# Patient Record
Sex: Male | Born: 1980 | Race: White | Hispanic: No | Marital: Married | Smoking: Current every day smoker
Health system: Southern US, Community
[De-identification: ages and names within clinical notes are randomized; demographics above are authoritative.]

## PROBLEM LIST (undated history)

## (undated) DIAGNOSIS — I219 Acute myocardial infarction, unspecified: Secondary | ICD-10-CM

## (undated) HISTORY — PX: CARDIAC SURGERY: SHX584

## (undated) HISTORY — PX: PACEMAKER INSERTION: SHX728

---
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
## 1998-03-09 ENCOUNTER — Encounter: Admission: RE | Admit: 1998-03-09 | Discharge: 1998-03-09 | Payer: Self-pay | Admitting: Internal Medicine

## 1999-06-16 ENCOUNTER — Emergency Department (HOSPITAL_COMMUNITY): Admission: EM | Admit: 1999-06-16 | Discharge: 1999-06-16 | Payer: Self-pay | Admitting: Emergency Medicine

## 1999-08-06 ENCOUNTER — Emergency Department (HOSPITAL_COMMUNITY): Admission: EM | Admit: 1999-08-06 | Discharge: 1999-08-06 | Payer: Self-pay

## 2001-12-06 ENCOUNTER — Emergency Department (HOSPITAL_COMMUNITY): Admission: EM | Admit: 2001-12-06 | Discharge: 2001-12-06 | Payer: Self-pay | Admitting: Emergency Medicine

## 2002-02-16 ENCOUNTER — Emergency Department (HOSPITAL_COMMUNITY): Admission: EM | Admit: 2002-02-16 | Discharge: 2002-02-16 | Payer: Self-pay | Admitting: *Deleted

## 2002-02-16 ENCOUNTER — Encounter: Payer: Self-pay | Admitting: Emergency Medicine

## 2002-08-15 ENCOUNTER — Inpatient Hospital Stay (HOSPITAL_COMMUNITY): Admission: EM | Admit: 2002-08-15 | Discharge: 2002-08-19 | Payer: Self-pay | Admitting: Emergency Medicine

## 2002-08-15 ENCOUNTER — Encounter: Payer: Self-pay | Admitting: Emergency Medicine

## 2003-02-14 ENCOUNTER — Encounter: Admission: RE | Admit: 2003-02-14 | Discharge: 2003-02-14 | Payer: Self-pay | Admitting: Internal Medicine

## 2003-12-22 ENCOUNTER — Emergency Department (HOSPITAL_COMMUNITY): Admission: EM | Admit: 2003-12-22 | Discharge: 2003-12-22 | Payer: Self-pay | Admitting: Emergency Medicine

## 2004-10-06 ENCOUNTER — Emergency Department: Payer: Self-pay | Admitting: Emergency Medicine

## 2005-12-11 ENCOUNTER — Emergency Department (HOSPITAL_COMMUNITY): Admission: EM | Admit: 2005-12-11 | Discharge: 2005-12-11 | Payer: Self-pay | Admitting: Emergency Medicine

## 2006-10-09 ENCOUNTER — Emergency Department: Payer: Self-pay | Admitting: Emergency Medicine

## 2007-04-30 ENCOUNTER — Emergency Department (HOSPITAL_COMMUNITY): Admission: EM | Admit: 2007-04-30 | Discharge: 2007-04-30 | Payer: Self-pay | Admitting: Emergency Medicine

## 2009-02-23 ENCOUNTER — Inpatient Hospital Stay: Payer: Self-pay | Admitting: Internal Medicine

## 2009-11-11 ENCOUNTER — Emergency Department: Payer: Self-pay | Admitting: Emergency Medicine

## 2009-12-01 ENCOUNTER — Inpatient Hospital Stay: Payer: Self-pay | Admitting: Cardiovascular Disease

## 2009-12-01 DIAGNOSIS — I2510 Atherosclerotic heart disease of native coronary artery without angina pectoris: Secondary | ICD-10-CM

## 2009-12-01 DIAGNOSIS — E039 Hypothyroidism, unspecified: Secondary | ICD-10-CM

## 2010-02-17 ENCOUNTER — Encounter: Payer: Self-pay | Admitting: Cardiology

## 2010-02-17 ENCOUNTER — Observation Stay (HOSPITAL_COMMUNITY): Admission: EM | Admit: 2010-02-17 | Discharge: 2010-02-18 | Payer: Self-pay | Admitting: Emergency Medicine

## 2010-02-17 ENCOUNTER — Ambulatory Visit: Payer: Self-pay | Admitting: Internal Medicine

## 2010-08-26 LAB — CBC
HCT: 41.3 % (ref 39.0–52.0)
Hemoglobin: 13.6 g/dL (ref 13.0–17.0)
Hemoglobin: 14.4 g/dL (ref 13.0–17.0)
MCH: 28.1 pg (ref 26.0–34.0)
MCH: 28.8 pg (ref 26.0–34.0)
MCHC: 34.2 g/dL (ref 30.0–36.0)
MCV: 82.6 fL (ref 78.0–100.0)
Platelets: 251 10*3/uL (ref 150–400)
Platelets: 302 10*3/uL (ref 150–400)
RBC: 5 MIL/uL (ref 4.22–5.81)
RDW: 14.7 % (ref 11.5–15.5)

## 2010-08-26 LAB — POCT CARDIAC MARKERS
CKMB, poc: 1.2 ng/mL (ref 1.0–8.0)
CKMB, poc: 2 ng/mL (ref 1.0–8.0)
Myoglobin, poc: 44 ng/mL (ref 12–200)
Myoglobin, poc: 64.4 ng/mL (ref 12–200)
Troponin i, poc: <0.05 ng/mL (ref 0.00–0.09)

## 2010-08-26 LAB — BASIC METABOLIC PANEL
BUN: 8 mg/dL (ref 6–23)
CO2: 26 mEq/L (ref 19–32)
Calcium: 9.3 mg/dL (ref 8.4–10.5)
Creatinine, Ser: 0.98 mg/dL (ref 0.4–1.5)
GFR calc non Af Amer: >60 mL/min (ref >60)
Glucose, Bld: 98 mg/dL (ref 70–99)

## 2010-08-26 LAB — CULTURE, BLOOD (ROUTINE X 2): Culture: NO GROWTH

## 2010-08-26 LAB — DIFFERENTIAL
Eosinophils Absolute: 0.2 10*3/uL (ref 0.0–0.7)
Eosinophils Relative: 1 % (ref 0–5)
Lymphocytes Relative: 10 % — ABNORMAL LOW (ref 12–46)
Lymphs Abs: 2.1 10*3/uL (ref 0.7–4.0)
Monocytes Absolute: 1.1 10*3/uL — ABNORMAL HIGH (ref 0.1–1.0)
Monocytes Relative: 6 % (ref 3–12)

## 2010-08-26 LAB — URINALYSIS, ROUTINE W REFLEX MICROSCOPIC
Bilirubin Urine: NEGATIVE
Glucose, UA: NEGATIVE mg/dL
Hgb urine dipstick: NEGATIVE
Protein, ur: NEGATIVE mg/dL
Specific Gravity, Urine: 1.01 (ref 1.005–1.030)
Urobilinogen, UA: 0.2 mg/dL (ref 0.0–1.0)

## 2010-08-26 LAB — POCT I-STAT, CHEM 8
Chloride: 105 mEq/L (ref 96–112)
Creatinine, Ser: 1.2 mg/dL (ref 0.4–1.5)
Glucose, Bld: 154 mg/dL — ABNORMAL HIGH (ref 70–99)
Potassium: 4 mEq/L (ref 3.5–5.1)
Sodium: 135 mEq/L (ref 135–145)

## 2010-08-26 LAB — URINE CULTURE
Colony Count: NO GROWTH
Culture: NO GROWTH

## 2010-08-26 LAB — CARDIAC PANEL(CRET KIN+CKTOT+MB+TROPI)
CK, MB: 1.7 ng/mL (ref 0.3–4.0)
Relative Index: INVALID (ref 0.0–2.5)
Relative Index: INVALID (ref 0.0–2.5)
Total CK: 77 U/L (ref 7–232)
Total CK: 91 U/L (ref 7–232)
Troponin I: 0.02 ng/mL (ref 0.00–0.06)

## 2010-08-26 LAB — LIPID PANEL
Cholesterol: 114 mg/dL (ref 0–200)
Total CHOL/HDL Ratio: 2.7 RATIO

## 2010-08-26 LAB — PROTIME-INR: INR: 2.05 — ABNORMAL HIGH (ref 0.00–1.49)

## 2010-08-26 LAB — CK TOTAL AND CKMB (NOT AT ARMC): Relative Index: 1.8 (ref 0.0–2.5)

## 2010-08-26 LAB — TSH: TSH: 4.716 u[IU]/mL — ABNORMAL HIGH (ref 0.350–4.500)

## 2010-10-29 NOTE — Discharge Summary (Signed)
NAME:  Bryan Gardner, Bryan Gardner                          ACCOUNT NO.:  [id number]   MEDICAL RECORD NO.:  [id number]                   PATIENT TYPE:  INP   LOCATION:  3730                                 FACILITY:  [facility]   PHYSICIAN:  C. Ulyess Mort, M.D.             DATE OF BIRTH:  [birth year]   DATE OF ADMISSION:  08/15/2002  DATE OF DISCHARGE:  08/19/2002                                 DISCHARGE SUMMARY   DISCHARGE DIAGNOSES:  1. Tylenol overdose, intentional.  2. Depression not otherwise specified.  3. Hypokalemia.   DISCHARGE MEDICATIONS:  Zoloft 50 mg 1 p.o. daily for the first week and 2  p.o. daily for the second week, then 3 p.o. daily thereafter.   FOLLOW UP:  1. The patient will be seen in follow up in the [facility]     [facility] on Thursday, March 11, at 2:30 p.m. for followup hospital visit     and also to recheck a comprehensive metabolic panel.  2. The patient was given the phone number for [facility] with instructions that he may call them at his desire to arrange     followup appointment or counseling.   PROCEDURES:  None.   CONSULTATIONS:  Courtesy of Antonietta Breach, M.D., [facility].   HISTORY AND PHYSICAL:  The patient is a 30 year old white male with a  history of major depressive episodes and multiple previous suicide attempts  and hospitalizations.  He was  brought to the emergency department by  ambulance at approximately 2 a.m. on the morning of admission after  ingesting a handful of Tylenol and Advil approximately 1:30 in the morning  in intentional suicide attempt precipitated by breakup with patient's  girlfriend.  He denied any other ingestions and denies any previous history  of suicide attempts. The history of previous suicide attempts is provided by  the patient's mother.   In the emergency department, the patient received activated charcoal as well  as Zofran for nausea.  His initial  physical examination was unremarkable,  and he was awake, alert, oriented, and cooperative.   LABORATORY DATA:  Laboratory studies on admission included a sodium of 138,  potassium 3.7, chloride 106, bicarb 26, BUN 6, creatinine 1.1, and glucose  103.  Total bilirubin 0.4, alkaline phosphatase 51, total protein 6.2,  albumin 3.8.   PA and lateral chest film revealed no acute disease.   EKG was read as normal sinus rhythm with a rate of 71, normal intervals with  occasional sinus pauses.   The patient had a negative alcohol screen, negative urine drug screen  including PCAs .  The acetaminophen level at 2 a.m. was 211.  The  acetaminophen level at 4:45 a.m. was 183.2.    HOSPITAL COURSE:  1. TYLENOL OVERDOSE:  The patient was admitted to a telemetry bed with a 24-  hour sitter.  He was given a 10 g bolus of Mucomyst (acetylcysteine)     after his activated charcoal in the emergency department.  He was     continued on Mucomyst 5 g q.4h. for a total of 17 doses.  However,     because of initial vomiting after the charcoal and Mucomyst, an NG tube     was placed, and the patient received the balance of his Mucomyst through     his NG tube.  The patient was started on IV fluids with normal saline at     150 ml an hour.  The patient received a full course of Mucomyst given     that and the patient's four-hour Tylenol level placed him right at the     cutoff between unlikely hepatotoxicity and probable hepatotoxicity based     on the Rumack-Matthew nomogram for Tylenol intoxification.  Throughout     the hospital course, the patient's transaminase, pro time, and mental     status were monitored.  The only abnormality noted of which was a very     mild transaminitis which was first manifest on the morning of hospital     day #4 with an AST of 45 and ALT of 38.  On the morning of hospital day     #5, the AST was down to 38 while the ALT was minimally elevated further     at 45.   However, this was not felt to represent a potential for     significant liver injury. There is a paper published in the anals of     emergency medicine by Loyal Buba., et al, in 1995 which states that     patients who do develop acute liver injury following Tylenol, 100% will     have a transaminitis by 48 hours.  Therefore, the patient was felt stable     for discharge after completing the full course of Mucomyst.  He will have     followup at [facility] on Thursday, March 11,     where we will repeat a comprehensive metabolic panel and again look     closely at his transaminases.   1. DEPRESSION NOT OTHERWISE SPECIFIED:  It was initially felt that the     patient had taken an intentional overdose of Tylenol in a suicide     attempt. As such, the patient was assigned a sitter to stay with him 24     hours until the patient could be cleared by psychiatry.  Dr. Jeanie Sewer     came by to see the patient on the evening of hospital day #2 and felt     that the patient's action most likely represented an Axis II basis     manifesting as a stress reactive crisis; however, he felt that the     patient would benefit from restarting on SSRIs.  Dr. Jeanie Sewer felt that     the patient was not acutely at risk of harming himself; however, he     recommended psychiatric followup. At this point, the sitter was     discontinued, and the patient was felt to no longer be a suicide risk.     On hospital day #3, the patient was restarted on Zoloft 15 mg p.o. daily.     At the time of discharge, the patient denied suicidal ideation and was     looking forward to leaving the hospital and returning  back to his usual     activities.   1. HYPOKALEMIA:  The patient had an admission potassium level of 3.7.     However, on the morning of hospital day #3, the patient was noted to be    hypokalemic at 3.0.  Potassium was replaced with 80 mEq potassium     chloride.  On the morning of  hospital day #4, the patient's potassium was     3.2, and he was again given potassium chloride replacement p.o.  On the     morning of hospital day #5, the patient's potassium was 3.1, and this was     replaced again with p.o. potassium.  It was felt possibly that the     patient's NG tube or the activated charcoal was possibly interfering with     his p.o. potassium absorption.  A magnesium level was checked which was     within normal limits at 1.8, and the patient remained asymptomatic. We     will follow this when we see the patient back in the clinic on Thursday.    DISCHARGE LABORATORY DATA:  Sodium 143, potassium 3.1, chloride 103, bicarb  29, BUN 7, creatinine 1.0, glucose 108, AST 38, ALT 45, total bilirubin 0.6,  alkaline phosphatase 56.  Magnesium level 1.8.  Total protein 6.8, albumin  4.0, calcium 9.7.     Thayer Headings, M.D.                     Gary Fleet, M.D.    BM/MEDQ  D:  08/19/2002  T:  08/20/2002  Job:  [id number]   cc:   Antonietta Breach, M.D.  [address]  [place], [place] [address]  Fax: [phone]   Outpatient Clinic

## 2010-11-16 DIAGNOSIS — Z720 Tobacco use: Secondary | ICD-10-CM | POA: Insufficient documentation

## 2011-03-22 LAB — DIFFERENTIAL
Eosinophils Relative: 2
Lymphocytes Relative: 27
Lymphs Abs: 3.2
Monocytes Relative: 7
Neutrophils Relative %: 64

## 2011-03-22 LAB — CBC
HCT: 44.1
Platelets: 277
RBC: 5.08
WBC: 12 — ABNORMAL HIGH

## 2011-03-22 LAB — URINALYSIS, ROUTINE W REFLEX MICROSCOPIC
Glucose, UA: NEGATIVE
Ketones, ur: 15 — AB
Nitrite: NEGATIVE
Protein, ur: NEGATIVE
Urobilinogen, UA: 1

## 2011-03-22 LAB — I-STAT 8, (EC8 V) (CONVERTED LAB)
BUN: 10
Bicarbonate: 26.5 — ABNORMAL HIGH
Glucose, Bld: 86
Hemoglobin: 16
Sodium: 139
pH, Ven: 7.399 — ABNORMAL HIGH

## 2012-09-18 DIAGNOSIS — Z95810 Presence of automatic (implantable) cardiac defibrillator: Secondary | ICD-10-CM | POA: Insufficient documentation

## 2012-09-18 DIAGNOSIS — I5022 Chronic systolic (congestive) heart failure: Secondary | ICD-10-CM | POA: Insufficient documentation

## 2012-09-19 DIAGNOSIS — I513 Intracardiac thrombosis, not elsewhere classified: Secondary | ICD-10-CM | POA: Insufficient documentation

## 2012-09-19 DIAGNOSIS — E785 Hyperlipidemia, unspecified: Secondary | ICD-10-CM | POA: Insufficient documentation

## 2016-01-05 ENCOUNTER — Encounter (HOSPITAL_COMMUNITY): Payer: Self-pay | Admitting: *Deleted

## 2016-01-05 ENCOUNTER — Emergency Department (HOSPITAL_COMMUNITY): Payer: Medicare Other

## 2016-01-05 ENCOUNTER — Emergency Department (HOSPITAL_COMMUNITY)
Admission: EM | Admit: 2016-01-05 | Discharge: 2016-01-06 | Disposition: A | Discharge status: Other Acute Inpatient Hospital | Payer: Medicare Other | Attending: Emergency Medicine | Admitting: Emergency Medicine

## 2016-01-05 DIAGNOSIS — Z79899 Other long term (current) drug therapy: Secondary | ICD-10-CM | POA: Diagnosis not present

## 2016-01-05 DIAGNOSIS — T1491XA Suicide attempt, initial encounter: Secondary | ICD-10-CM

## 2016-01-05 DIAGNOSIS — F29 Unspecified psychosis not due to a substance or known physiological condition: Secondary | ICD-10-CM | POA: Diagnosis not present

## 2016-01-05 DIAGNOSIS — R45851 Suicidal ideations: Secondary | ICD-10-CM | POA: Diagnosis not present

## 2016-01-05 DIAGNOSIS — T1491 Suicide attempt: Secondary | ICD-10-CM | POA: Diagnosis not present

## 2016-01-05 HISTORY — DX: Acute myocardial infarction, unspecified: I21.9

## 2016-01-05 LAB — COMPREHENSIVE METABOLIC PANEL
ALT: 16 U/L — ABNORMAL LOW (ref 17–63)
AST: 21 U/L (ref 15–41)
Albumin: 4.2 g/dL (ref 3.5–5.0)
Alkaline Phosphatase: 80 U/L (ref 38–126)
Anion gap: 6 (ref 5–15)
BUN: 5 mg/dL — AB (ref 6–20)
CALCIUM: 9.5 mg/dL (ref 8.9–10.3)
CHLORIDE: 105 mmol/L (ref 101–111)
CO2: 24 mmol/L (ref 22–32)
CREATININE: 1.1 mg/dL (ref 0.61–1.24)
GFR calc Af Amer: >60 mL/min (ref >60)
Glucose, Bld: 114 mg/dL — ABNORMAL HIGH (ref 65–99)
POTASSIUM: 3.7 mmol/L (ref 3.5–5.1)
Sodium: 135 mmol/L (ref 135–145)
TOTAL PROTEIN: 7.4 g/dL (ref 6.5–8.1)
Total Bilirubin: 0.5 mg/dL (ref 0.3–1.2)

## 2016-01-05 LAB — CBC WITH DIFFERENTIAL/PLATELET
Basophils Absolute: 0 10*3/uL (ref 0.0–0.1)
Basophils Relative: 0 %
EOS PCT: 0 %
Eosinophils Absolute: 0 10*3/uL (ref 0.0–0.7)
HCT: 41 % (ref 39.0–52.0)
Hemoglobin: 14.1 g/dL (ref 13.0–17.0)
LYMPHS ABS: 1.4 10*3/uL (ref 0.7–4.0)
LYMPHS PCT: 10 %
MCH: 27.8 pg (ref 26.0–34.0)
MCHC: 34.4 g/dL (ref 30.0–36.0)
MCV: 80.7 fL (ref 78.0–100.0)
MONO ABS: 0.6 10*3/uL (ref 0.1–1.0)
MONOS PCT: 4 %
Neutro Abs: 12.9 10*3/uL — ABNORMAL HIGH (ref 1.7–7.7)
Neutrophils Relative %: 86 %
PLATELETS: 266 10*3/uL (ref 150–400)
RBC: 5.08 MIL/uL (ref 4.22–5.81)
RDW: 13.7 % (ref 11.5–15.5)
WBC: 14.9 10*3/uL — ABNORMAL HIGH (ref 4.0–10.5)

## 2016-01-05 LAB — RAPID URINE DRUG SCREEN, HOSP PERFORMED
AMPHETAMINES: NOT DETECTED
Barbiturates: NOT DETECTED
Benzodiazepines: NOT DETECTED
Cocaine: NOT DETECTED
Opiates: NOT DETECTED
Tetrahydrocannabinol: NOT DETECTED

## 2016-01-05 LAB — ACETAMINOPHEN LEVEL

## 2016-01-05 LAB — ETHANOL: Alcohol, Ethyl (B): <5 mg/dL (ref <5)

## 2016-01-05 LAB — SALICYLATE LEVEL

## 2016-01-05 MED ORDER — LISINOPRIL 10 MG PO TABS
5.0000 mg | ORAL_TABLET | Freq: Every day | ORAL | Status: DC
  Administered 2016-01-05 – 2016-01-06 (×2): 5 mg via ORAL
  Filled 2016-01-05 (×3): qty 1

## 2016-01-05 MED ORDER — LORAZEPAM 1 MG PO TABS: 1.0000 mg | ORAL_TABLET | Freq: Three times a day (TID) | ORAL | Status: DC | PRN

## 2016-01-05 MED ORDER — NICOTINE 21 MG/24HR TD PT24
21.0000 mg | MEDICATED_PATCH | Freq: Every day | TRANSDERMAL | Status: DC
Start: 2016-01-05 — End: 2016-01-06
  Administered 2016-01-06: 21 mg via TRANSDERMAL
  Filled 2016-01-05 (×2): qty 1

## 2016-01-05 MED ORDER — CARVEDILOL 12.5 MG PO TABS
6.2500 mg | ORAL_TABLET | Freq: Two times a day (BID) | ORAL | Status: DC
  Administered 2016-01-05 – 2016-01-06 (×3): 6.25 mg via ORAL
  Filled 2016-01-05 (×3): qty 1

## 2016-01-05 MED ORDER — ATORVASTATIN CALCIUM 40 MG PO TABS
40.0000 mg | ORAL_TABLET | Freq: Every evening | ORAL | Status: DC
  Administered 2016-01-05: 40 mg via ORAL
  Filled 2016-01-05: qty 1

## 2016-01-05 MED ORDER — IBUPROFEN 200 MG PO TABS: 600.0000 mg | ORAL_TABLET | Freq: Three times a day (TID) | ORAL | Status: DC | PRN

## 2016-01-05 MED ORDER — ONDANSETRON HCL 4 MG PO TABS: 4.0000 mg | ORAL_TABLET | Freq: Three times a day (TID) | ORAL | Status: DC | PRN

## 2016-01-05 MED ORDER — ACETAMINOPHEN 325 MG PO TABS: 650.0000 mg | ORAL_TABLET | ORAL | Status: DC | PRN

## 2016-01-05 MED ORDER — SPIRONOLACTONE 25 MG PO TABS
25.0000 mg | ORAL_TABLET | Freq: Every day | ORAL | Status: DC
  Administered 2016-01-05: 25 mg via ORAL
  Filled 2016-01-05 (×2): qty 1

## 2016-01-05 MED ORDER — SERTRALINE HCL 50 MG PO TABS
50.0000 mg | ORAL_TABLET | Freq: Every day | ORAL | Status: DC
  Administered 2016-01-05 – 2016-01-06 (×2): 50 mg via ORAL
  Filled 2016-01-05 (×2): qty 1

## 2016-01-05 MED ORDER — ASPIRIN EC 81 MG PO TBEC
81.0000 mg | DELAYED_RELEASE_TABLET | Freq: Every day | ORAL | Status: DC
  Administered 2016-01-05 – 2016-01-06 (×2): 81 mg via ORAL
  Filled 2016-01-05 (×2): qty 1

## 2016-01-05 MED ORDER — ZOLPIDEM TARTRATE 5 MG PO TABS: 5.0000 mg | ORAL_TABLET | Freq: Every evening | ORAL | Status: DC | PRN

## 2016-01-05 MED ORDER — FUROSEMIDE 20 MG PO TABS
40.0000 mg | ORAL_TABLET | Freq: Two times a day (BID) | ORAL | Status: DC
  Administered 2016-01-05 – 2016-01-06 (×3): 40 mg via ORAL
  Filled 2016-01-05 (×3): qty 2

## 2016-01-05 MED ORDER — ALUM & MAG HYDROXIDE-SIMETH 200-200-20 MG/5ML PO SUSP: 30.0000 mL | ORAL | Status: DC | PRN

## 2016-01-05 NOTE — ED Notes (Signed)
Sitter arrived

## 2016-01-05 NOTE — ED Notes (Signed)
Pt speaking with his mother and has now used all of his phone calls for the day.

## 2016-01-05 NOTE — ED Triage Notes (Signed)
Pt arrives from home via [facility] and also being escorted by [employer]. Pt was found by a friend standing on a pain bucket with a cord wrapped around his neck. The pt then kicked the paint bucket out from under him and began flailing around, the friend attempted to cut pt down with no success. Friend states the pt then began convulsing and had eye rolling with a positive loc. The friend put his fingers in between the cord and the pt neck and began to pull, effectively freeing the pt. The pt hit his head on a piece of wood after falling to the floor. Upon regaining consciousness the pt then got up and ran away. Pt was unable to run far rt an extensive cardiac hx.

## 2016-01-05 NOTE — ED Provider Notes (Signed)
[facility] DEPT Provider Note   CSN: [id number] Arrival date & time: 01/05/16  1418  First Provider Contact:  First MD Initiated Contact with Patient 01/05/16 1427        History   Chief Complaint No chief complaint on file.   HPI Bryan Gardner is a 35 y.o. male.  The history is provided by the patient, the police and medical records.   35 year old male with hx of CHF here after attempted suicide.  Patient reports for the past 3 days he has been feeling increasingly depressed. He reports this is mostly because he is having significant strain on the relationship with the mother of his 44 year old son. He states today he tried to hang himself with an old TV cord from in his garage. He reports his friend walked in shortly after and cut him down with a knife. He reports he was only hanging therefore 15 seconds or less. He denies any loss of consciousness. Friend called EMS and he was placed in a c-collar on the scene. Patient reports he does have history of depression and takes generic Zoloft for this. He states initially he felt it was helping but not so much recently. He does report occasional marijuana use. No significant alcohol use. No history of suicidal actions in the past.  Denies homicidal ideation.  No hallucinations.  Patient admits that his actions today were "stupid" because he knows that his son needs him.  States his throat feels sore currently, no other complaints.  Specifically denies any chest pain or shortness of breath.  No past medical history on file.  There are no active problems to display for this patient.   No past surgical history on file.     Home Medications    Prior to Admission medications   Not on File    Family History No family history on file.  Social History Social History  Substance Use Topics  . Smoking status: Not on file  . Smokeless tobacco: Not on file  . Alcohol use Not on file     Allergies   Review of patient's allergies  indicates not on file.   Review of Systems Review of Systems  Musculoskeletal: Positive for neck pain.  Psychiatric/Behavioral: Positive for suicidal ideas.  All other systems reviewed and are negative.    Physical Exam Updated Vital Signs BP 122/86 (BP Location: Right Arm)   Pulse 93   Temp 99.1 F (37.3 C) (Oral)   Resp 16   SpO2 96%   Physical Exam  Constitutional: He is oriented to person, place, and time. He appears well-developed and well-nourished.  HENT:  Head: Normocephalic and atraumatic.  Mouth/Throat: Oropharynx is clear and moist.  Eyes: Conjunctivae and EOM are normal. Pupils are equal, round, and reactive to light.  Neck:  Ligature marks noted to neck; no tracheal deviation or swelling noted; no oropharyngeal edema, handling secretions well, normal phonation without stridor c-collar in place; ROM not tested  Cardiovascular: Normal rate, regular rhythm and normal heart sounds.   Pulmonary/Chest: Effort normal and breath sounds normal.  Defibrillator left chest wall, no signs of surrounding infection  Abdominal: Soft. Bowel sounds are normal.  Musculoskeletal: Normal range of motion.  Neurological: He is alert and oriented to person, place, and time.  AAOx3, answering questions and following commands appropriately; equal strength UE and LE bilaterally; CN grossly intact; moves all extremities appropriately without ataxia; no focal neuro deficits or facial asymmetry appreciated  Skin: Skin is warm and dry.  Psychiatric: He has a normal mood and affect. He is not withdrawn. He expresses suicidal ideation. He expresses suicidal plans. He expresses no homicidal plans.  Nursing note and vitals reviewed.    ED Treatments / Results  Labs (all labs ordered are listed, but only abnormal results are displayed) Labs Reviewed  CBC WITH DIFFERENTIAL/PLATELET - Abnormal; Notable for the following:       Result Value   WBC 14.9 (*)    Neutro Abs 12.9 (*)    All other  components within normal limits  ACETAMINOPHEN LEVEL - Abnormal; Notable for the following:    Acetaminophen (Tylenol), Serum <10 (*)    All other components within normal limits  COMPREHENSIVE METABOLIC PANEL - Abnormal; Notable for the following:    Glucose, Bld 114 (*)    BUN 5 (*)    ALT 16 (*)    All other components within normal limits  SALICYLATE LEVEL  ETHANOL  URINE RAPID DRUG SCREEN, HOSP PERFORMED    EKG  EKG Interpretation None       Radiology Ct Cervical Spine Wo Contrast  Result Date: 01/05/2016 CLINICAL DATA:  Suicide attempt. The patient tried to hang himself with a television cord. EXAM: CT CERVICAL SPINE WITHOUT CONTRAST TECHNIQUE: Multidetector CT imaging of the cervical spine was performed without intravenous contrast. Multiplanar CT image reconstructions were also generated. COMPARISON:  None. FINDINGS: The cervical spine is imaged from skullbase through C7-T1. Vertebral body heights alignment are maintained. Mild uncovertebral spurring is present at C3-4, C5-6, and C6-7. Osseous foraminal narrowing is most prominent on the left at C6-7. No acute fracture or traumatic subluxation is present. Despite the given history common no significant soft tissue injury is present. High-density tissue is present of the tongue base near the level of the hyoid measuring 3.6 x 2.5 x 2.7 cm. There is additional high attenuation soft tissue at the foramen cecum measuring 1.51.3 x 2.4 cm. Of note, no thyroid tissue is evident within the thyroid bed. The lung apices demonstrate apical coli. IMPRESSION: 1. No acute fracture or traumatic subluxation. 2. No acute soft tissue injury. 3. Ectopic thyroid tissue at tongue base without significant thyroid at the level of thyroid cartilage. 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 Electronically Signed   By: Marin Roberts M.D.   On: 01/05/2016 15:15   Procedures Procedures (including critical care  time)  Medications Ordered in ED Medications - No data to display   Initial Impression / Assessment and Plan / ED Course  I have reviewed the triage vital signs and the nursing notes.  Pertinent labs & imaging results that were available during my care of the patient were reviewed by me and considered in my medical decision making (see chart for details).  Clinical Course   35 year old male here after attempted suicide by hanging. He hung himself with a TV cord was cut down by his friend after 15 seconds later.  He arrives via EMS with c-collar in place. Patient is awake, alert, fully oriented. He does report a sore throat but denies any other complaints. He does have ligature marks of the neck without any tracheal deviation or swelling. His oropharynx is clear, normal phonation without stridor. CT cervical spine obtained, no acute fracture or underlying soft tissue injuries. His lab work is reassuring. Patient has tolerated water without difficulty. He has no other complaints at this time. Patient is currently here voluntarily, has been calm and cooperative. Patient placed into psych hold.  Will  consult TTS.  Holding orders and home meds in place.  1800-- patient began questioning when he could leave and if he had to stay here for evaluation or not.  He did not threaten to leave or make any gesture to leave.  IVC paperwork was filled out as a precaution.  Final Clinical Impressions(s) / ED Diagnoses   Final diagnoses:  Suicide attempt [facility])    New Prescriptions New Prescriptions   No medications on file     Garlon Hatchet, Cordelia Poche 01/05/16 1933    Jacalyn Lefevre, MD 01/06/16 [phone]

## 2016-01-05 NOTE — ED Notes (Signed)
Pt changed into scrubs and belongings  (2bags) taken over to pod C behind nurse's station.

## 2016-01-05 NOTE — ED Notes (Signed)
IVC paperwork faxed to magistrate by Loma Newton, unit secretary. Original copy placed in red folder on SW desk. Copies placed on clipboard in patient chart.

## 2016-01-05 NOTE — ED Notes (Signed)
Pt moved to room 22 ambulates with steady gait. respeven and non labored. C/o sore throat.

## 2016-01-06 ENCOUNTER — Encounter (HOSPITAL_COMMUNITY): Payer: Self-pay

## 2016-01-06 ENCOUNTER — Inpatient Hospital Stay (HOSPITAL_COMMUNITY)
Admission: AD | Admit: 2016-01-06 | Discharge: 2016-01-11 | DRG: 885 | Disposition: A | Discharge status: Home / Self Care | Payer: Medicare Other | Attending: Psychiatry | Admitting: Psychiatry

## 2016-01-06 DIAGNOSIS — I25119 Atherosclerotic heart disease of native coronary artery with unspecified angina pectoris: Secondary | ICD-10-CM | POA: Diagnosis present

## 2016-01-06 DIAGNOSIS — G4700 Insomnia, unspecified: Secondary | ICD-10-CM | POA: Diagnosis present

## 2016-01-06 DIAGNOSIS — Z950 Presence of cardiac pacemaker: Secondary | ICD-10-CM

## 2016-01-06 DIAGNOSIS — F329 Major depressive disorder, single episode, unspecified: Secondary | ICD-10-CM | POA: Diagnosis not present

## 2016-01-06 DIAGNOSIS — F17210 Nicotine dependence, cigarettes, uncomplicated: Secondary | ICD-10-CM | POA: Diagnosis present

## 2016-01-06 DIAGNOSIS — I252 Old myocardial infarction: Secondary | ICD-10-CM | POA: Diagnosis not present

## 2016-01-06 DIAGNOSIS — T1491 Suicide attempt: Secondary | ICD-10-CM | POA: Diagnosis not present

## 2016-01-06 DIAGNOSIS — F322 Major depressive disorder, single episode, severe without psychotic features: Secondary | ICD-10-CM | POA: Diagnosis not present

## 2016-01-06 DIAGNOSIS — Z79899 Other long term (current) drug therapy: Secondary | ICD-10-CM | POA: Diagnosis not present

## 2016-01-06 MED ORDER — TRAZODONE HCL 50 MG PO TABS
50.0000 mg | ORAL_TABLET | Freq: Every evening | ORAL | Status: DC | PRN
  Filled 2016-01-06 (×4): qty 1

## 2016-01-06 MED ORDER — SERTRALINE HCL 50 MG PO TABS
50.0000 mg | ORAL_TABLET | Freq: Every day | ORAL | Status: DC
  Administered 2016-01-07: 50 mg via ORAL
  Filled 2016-01-06 (×3): qty 1

## 2016-01-06 MED ORDER — ATORVASTATIN CALCIUM 40 MG PO TABS
40.0000 mg | ORAL_TABLET | Freq: Every evening | ORAL | Status: DC
  Administered 2016-01-06 – 2016-01-10 (×5): 40 mg via ORAL
  Filled 2016-01-06 (×7): qty 1

## 2016-01-06 MED ORDER — FUROSEMIDE 40 MG PO TABS
40.0000 mg | ORAL_TABLET | Freq: Two times a day (BID) | ORAL | Status: DC
  Administered 2016-01-06 – 2016-01-11 (×10): 40 mg via ORAL
  Filled 2016-01-06 (×15): qty 1

## 2016-01-06 MED ORDER — CARVEDILOL 6.25 MG PO TABS
6.2500 mg | ORAL_TABLET | Freq: Two times a day (BID) | ORAL | Status: DC
  Administered 2016-01-06 – 2016-01-11 (×10): 6.25 mg via ORAL
  Filled 2016-01-06 (×14): qty 1

## 2016-01-06 MED ORDER — NITROGLYCERIN 0.4 MG SL SUBL: 0.4000 mg | SUBLINGUAL_TABLET | SUBLINGUAL | Status: DC | PRN

## 2016-01-06 MED ORDER — LISINOPRIL 5 MG PO TABS
5.0000 mg | ORAL_TABLET | Freq: Every day | ORAL | Status: DC
  Administered 2016-01-07 – 2016-01-11 (×5): 5 mg via ORAL
  Filled 2016-01-06 (×8): qty 1

## 2016-01-06 MED ORDER — MAGNESIUM HYDROXIDE 400 MG/5ML PO SUSP: 30.0000 mL | Freq: Every day | ORAL | Status: DC | PRN

## 2016-01-06 MED ORDER — ALUM & MAG HYDROXIDE-SIMETH 200-200-20 MG/5ML PO SUSP: 30.0000 mL | ORAL | Status: DC | PRN

## 2016-01-06 MED ORDER — SPIRONOLACTONE 25 MG PO TABS
25.0000 mg | ORAL_TABLET | Freq: Every day | ORAL | Status: DC
  Administered 2016-01-06 – 2016-01-11 (×6): 25 mg via ORAL
  Filled 2016-01-06 (×8): qty 1

## 2016-01-06 MED ORDER — HYDROXYZINE HCL 25 MG PO TABS
25.0000 mg | ORAL_TABLET | Freq: Three times a day (TID) | ORAL | Status: DC | PRN
  Administered 2016-01-10: 25 mg via ORAL
  Filled 2016-01-06: qty 1

## 2016-01-06 MED ORDER — ASPIRIN EC 81 MG PO TBEC
81.0000 mg | DELAYED_RELEASE_TABLET | Freq: Every day | ORAL | Status: DC
  Administered 2016-01-07 – 2016-01-11 (×5): 81 mg via ORAL
  Filled 2016-01-06 (×8): qty 1

## 2016-01-06 MED ORDER — ACETAMINOPHEN 325 MG PO TABS
650.0000 mg | ORAL_TABLET | Freq: Four times a day (QID) | ORAL | Status: DC | PRN
Start: 2016-01-06 — End: 2016-01-11

## 2016-01-06 MED ORDER — NICOTINE 21 MG/24HR TD PT24
21.0000 mg | MEDICATED_PATCH | Freq: Every day | TRANSDERMAL | Status: DC
  Filled 2016-01-06 (×7): qty 1

## 2016-01-06 NOTE — ED Notes (Signed)
Pt refused ordered breakfast tray earlier because he "doesn't like breakfast foods." Pt agreed that a [place] sandwich would work though. Sandwich bag and drink given.

## 2016-01-06 NOTE — ED Notes (Signed)
Report called to Christa, RN at [facility]. Advised NS to contact [employer] for transport.

## 2016-01-06 NOTE — ED Notes (Signed)
Pt departed in custody of [employer], en route to [facility]. Belongings retrieved from storage and security safe. Signed for by pt, and given to [facility] officers. Pt departed in NAD.

## 2016-01-06 NOTE — ED Notes (Signed)
Attempted to call report to [facility] [id number]. Was informed that pt's bed was a "tentative placement" and thus not ready to received report or pt. Informed them that [employer] has already been contacted for transport. Told I would received follow-up phone call.

## 2016-01-06 NOTE — Progress Notes (Signed)
Pt accepted to [facility] bed 404-1 by Dr. Lucianne Muss, attending Dr. Jama Flavors. Number to call report is [phone].  Ilean Skill, MSW, LCSW Clinical Social Work, Disposition  01/06/2016 [phone]

## 2016-01-06 NOTE — Progress Notes (Signed)
Bryan Gardner is a 35 year old male being admitted involuntarily to 404-1 from [facility].  He was IVC'd for symptoms of depression and SI by the ED physician. Pt attempted to hang himself in his garage yesterday with a TV cord.  A friend happened by and intervened to save his life by cutting him down.  Recent stressors are that his girlfriend decided to break off their relationship and moved with their 65 year old son to [place]. 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 He reports that he is prescribed medication for his depression by his cardiologist but does not take them as prescribed.  He continues to report feeling depressed but "I am feeling much better."  He denies current SI/HI or A/V hallucinations.  He is diagnosed with Major Depressive Disorder, recurrent, severe.  Admission paperwork completed and signed.  Belongings searched and secured in locker # 40.  Skin assessment completed and noted defibrillator placement surgery scar.  Q 15 minute checks initiated for safety.  We will monitor the progress towards his goals.

## 2016-01-06 NOTE — ED Provider Notes (Signed)
  Physical Exam  BP 115/72   Pulse (!) 58   Temp 98.4 F (36.9 C)   Resp 18   SpO2 98%   Physical Exam  ED Course  Procedures  MDM Patient seen by TTS. Will admit to [facility] under Dr. Jama Flavors. Medically cleared by previous provider.       Charlynne Pander, MD 01/06/16 1155

## 2016-01-06 NOTE — ED Notes (Signed)
Contacted by [facility]. Pt has placement there. Room 404, Bed 1. Receiving MD is Dr. Jama Flavors. Call report to [phone].

## 2016-01-06 NOTE — Progress Notes (Signed)
Patient ID: Bryan Gardner, male   DOB: [birth year], 35 y.o.   MRN: [id number] PER STATE REGULATIONS 482.30  THIS CHART WAS REVIEWED FOR MEDICAL NECESSITY WITH RESPECT TO THE PATIENT'S ADMISSION/DURATION OF STAY.  NEXT REVIEW DATE:01/10/16  Loura Halt, RN, BSN CASE MANAGER

## 2016-01-06 NOTE — Tx Team (Signed)
Initial Interdisciplinary Treatment Plan   PATIENT STRESSORS: Health problems Loss of girlfriend (recent break up) Medication change or noncompliance   PATIENT STRENGTHS: Capable of independent living General fund of knowledge Supportive family/friends   PROBLEM LIST: Problem List/Patient Goals Date to be addressed Date deferred Reason deferred Estimated date of resolution  Depression 01/06/16     Suicide attempt 01/06/16     "I need some kind of stress management" 01/06/16     "I would like some options for outpatient treatment when I leave" 01/06/16                                    DISCHARGE CRITERIA:  Improved stabilization in mood, thinking, and/or behavior Verbal commitment to aftercare and medication compliance  PRELIMINARY DISCHARGE PLAN: Outpatient therapy Medication management  PATIENT/FAMIILY INVOLVEMENT: This treatment plan has been presented to and reviewed with the patient, ASSAD MUHAMMED.  The patient and family have been given the opportunity to ask questions and make suggestions.  Norm Parcel Aliece Honold 01/06/2016, 1:56 PM

## 2016-01-06 NOTE — BH Assessment (Addendum)
Tele Assessment Note   Bryan Gardner is an 35 y.o.twice divorced male who presents involuntarily (IVC'd by EDP) accompanied by [employer] reporting symptoms of depression and SI . Pt attempted to hang himself in his garage yesterday with a TV cord.  A friend happened by and intervened to save his life by cutting him down.  Prior to ED visit, pt reports that his GF decided to break off their relationship and moved with their 15 yo son to [place]. Pt reports that he had already been struggling with depression from a hx of heart conditions and surgeries. 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 Pt reports symptoms of depression including deep sadness, fatigue, excessive guilt, decreased self esteem, tearfulness & crying spells, self isolation, lack of motivation for activities and pleasure, irritability, negative outlook, difficulty thinking & concentrating, feeling helpless and hopeless.  Pt reports medication current prescribed by his cardiologist who prescribed an anti-depressant which pt sts he does not take as prescribed. Pt denies past suicide attempts. Pt denies homicidal ideation & a history of violence or anger outbursts. Legal history includes no arrests, past or present  Pt denies auditory or visual hallucinations or other psychotic symptoms.   Pt lives with a friend and supports include his mother . Pt reports completing school through the 10th grade. Pt's source of income includes disability income. Pt has poor insight and impaired judgment. Pt's memory seems intact. No history of physical, verbal, emotional or sexual abuse. Pt sts average sleep hours each night are about 7 hours and eats regularly and well having no weight loss or gain recently.   ? Pt's psychiatric history includes no OP treatment and no psychiatric hospitalizations. Pt admits alcohol/ substance abuse including current use of nicotine, alcohol and  occasional marijuana. Pt's BAL was <5 and UDS was negative for all substances tested for in the ED today.  ? MSE: Pt is dressed in scrubs, seems calm, oriented x4 with normal speech and normal motor behavior. Eye contact is good. Pt's mood is stated as depressed and affect appears blunted.  Affect seems congruent with mood. Thought process is coherent and relevant. There is no indication pt is currently responding to internal stimuli or experiencing delusional thought content. Pt was cooperative throughout assessment.    Diagnosis: MDD, Severe, Recurrent  Past Medical History:  Past Medical History:  Diagnosis Date  . Myocardial infarction [facility])     Past Surgical History:  Procedure Laterality Date  . CARDIAC SURGERY    . PACEMAKER INSERTION      Family History: History reviewed. No pertinent family history.  Social History:  reports that he has been smoking.  He has never used smokeless tobacco. He reports that he does not drink alcohol. His drug history is not on file.  Additional Social History:  Alcohol / Drug Use Prescriptions: see MAR History of alcohol / drug use?: Yes Longest period of sobriety (when/how long): unknown Substance #1 Name of Substance 1: Cannabis 1 - Age of First Use: 33 1 - Amount (size/oz): unknown 1 - Frequency: 2 x yr 1 - Duration: ongoing 1 - Last Use / Amount: last month Substance #2 Name of Substance 2: Alcohol 2 - Age of First Use: 19 2 - Amount (size/oz): unknown 2 - Frequency: Socially- 2 x month 2 - Duration: ongoing 2 - Last Use / Amount: last month Substance #3 Name of Substance 3: Nicotine/Cigarettes 3 -  Age of First Use: 9 3 - Amount (size/oz): 1 pack 3 - Frequency: daily 3 - Duration: ongoing 3 - Last Use / Amount: yesterday  CIWA: CIWA-Ar BP: 116/68 Pulse Rate: 75 COWS:    PATIENT STRENGTHS: (choose at least two) Average or above average intelligence Capable of independent living Communication skills Supportive  family/friends  Allergies:  Allergies  Allergen Reactions  . Bupropion Nausea And Vomiting    Home Medications:  (Not in a hospital admission)  OB/GYN Status:  No LMP for male patient.  General Assessment Data Location of Assessment: [facility] ED TTS Assessment: In system Is this a Tele or Face-to-Face Assessment?: Tele Assessment Is this an Initial Assessment or a Re-assessment for this encounter?: Initial Assessment Marital status: Divorced Living Arrangements: Non-relatives/Friends (lives w a friend) Can pt return to current living arrangement?: Yes Admission Status: Involuntary (IVC'd by EDP) Is patient capable of signing voluntary admission?: No (IVC'd) Referral Source: Other (PD) Insurance type:  (Medicare)  Medical Screening Exam [facility] Walk-in ONLY) Medical Exam completed: Yes  Crisis Care Plan Living Arrangements: Non-relatives/Friends (lives w a friend) Name of Psychiatrist:  Higher education careers adviser) Name of Therapist:  (none)  Education Status Is patient currently in school?: No Highest grade of school patient has completed:  (10)  Risk to self with the past 6 months Suicidal Ideation: Yes-Currently Present Has patient been a risk to self within the past 6 months prior to admission? : No Suicidal Intent: Yes-Currently Present Has patient had any suicidal intent within the past 6 months prior to admission? : No Is patient at risk for suicide?: Yes (Attempted suicide yesterday) Suicidal Plan?: Yes-Currently Present Has patient had any suicidal plan within the past 6 months prior to admission? : No Specify Current Suicidal Plan:  (Pt hung himself in garage of his home. Friend intervened.) Access to Means: Yes Specify Access to Suicidal Means:  (used TV cord to hang himself) What has been your use of drugs/alcohol within the last 12 months?:  (regular use) Previous Attempts/Gestures: No (denies) How many times?:  (0) Other Self Harm Risks:  (none noted) Triggers for Past  Attempts: None known Family Suicide History: Unknown Recent stressful life event(s): Loss (Comment) (GFbroke off with pt and took their 66 yo son to live in [place]) Persecutory voices/beliefs?: Yes (Pt sts he "feels stupid" and continnuously berating himself) Depression: Yes Depression Symptoms: Despondent, Tearfulness, Isolating, Fatigue, Guilt, Loss of interest in usual pleasures, Feeling worthless/self pity, Feeling angry/irritable Substance abuse history and/or treatment for substance abuse?: Yes Suicide prevention information given to non-admitted patients: Not applicable  Risk to Others within the past 6 months Homicidal Ideation: No (strongly denies) Does patient have any lifetime risk of violence toward others beyond the six months prior to admission? : No (strongly denies) Thoughts of Harm to Others: No Current Homicidal Intent: No Current Homicidal Plan: No Access to Homicidal Means: No (sts no access to firearms) Identified Victim:  (n/a) History of harm to others?:  (strongly denies) Assessment of Violence: None Noted Does patient have access to weapons?: No Criminal Charges Pending?: No Does patient have a court date: No Is patient on probation?: No  Psychosis Hallucinations: None noted (denies) Delusions: None noted  Mental Status Report Appearance/Hygiene: Unremarkable, In scrubs Eye Contact: Good Motor Activity: Freedom of movement Speech: Logical/coherent Level of Consciousness: Quiet/awake Mood: Depressed Affect: Blunted, Depressed Anxiety Level: Minimal Thought Processes: Coherent, Relevant Judgement: Impaired Orientation: Person, Place, Time, Situation Obsessive Compulsive Thoughts/Behaviors: None  Cognitive Functioning Concentration: Normal Memory: Recent  Intact, Remote Intact IQ: Average Insight: Fair Impulse Control: Poor Appetite: Good Weight Loss:  (0) Weight Gain:  (0) Sleep: No Change (7) Total Hours of Sleep:  (7 hrs) Vegetative Symptoms:  None  ADLScreening [facility] Assessment Services) Patient's cognitive ability adequate to safely complete daily activities?: Yes Patient able to express need for assistance with ADLs?: Yes Independently performs ADLs?: Yes (appropriate for developmental age)  Prior Inpatient Therapy Prior Inpatient Therapy: No  Prior Outpatient Therapy Prior Outpatient Therapy: No Does patient have an ACCT team?: No Does patient have Intensive In-House Services?  : No Does patient have [facility] services? : No Does patient have P4CC services?: Unknown  ADL Screening (condition at time of admission) Patient's cognitive ability adequate to safely complete daily activities?: Yes Patient able to express need for assistance with ADLs?: Yes Independently performs ADLs?: Yes (appropriate for developmental age)       Abuse/Neglect Assessment (Assessment to be complete while patient is alone) Physical Abuse: Denies Verbal Abuse: Denies Sexual Abuse: Denies Exploitation of patient/patient's resources: Denies Self-Neglect: Denies     Merchant navy officer (For Healthcare) Does patient have an advance directive?: No Would patient like information on creating an advanced directive?: No - patient declined information    Additional Information 1:1 In Past 12 Months?: No CIRT Risk: No Elopement Risk: No Does patient have medical clearance?: Yes     Disposition:  Disposition Initial Assessment Completed for this Encounter: Yes Disposition of Patient: Other dispositions Other disposition(s):  (Pending review w BHH Extender)  Per Donell Sievert, PA: Pt meets IP criteria. Recommend IP tx.  Per Shon Baton McNichol, AC: No appropriate beds available at [facility] currently.  TTS will seek placement.  Spoke with Dr. Cornell Barman, EDP, at [facility]: Advised of recommendation. He agrees.   Beryle Flock, MS, CRC, [facility] Triage Specialist [facility] T 01/06/2016 5:44 AM

## 2016-01-06 NOTE — ED Notes (Signed)
Pt presently located in Pod A, Room 12. Pt sleeping. Sitter at bedside.

## 2016-01-07 MED ORDER — TRAZODONE HCL 50 MG PO TABS
50.0000 mg | ORAL_TABLET | Freq: Every evening | ORAL | Status: DC | PRN
  Administered 2016-01-10: 50 mg via ORAL
  Filled 2016-01-07: qty 1

## 2016-01-07 MED ORDER — SERTRALINE HCL 100 MG PO TABS
100.0000 mg | ORAL_TABLET | Freq: Every day | ORAL | Status: DC
  Administered 2016-01-08 – 2016-01-11 (×4): 100 mg via ORAL
  Filled 2016-01-07 (×6): qty 1
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
## 2016-01-07 NOTE — BHH Group Notes (Signed)
BHH LCSW Group Therapy 01/07/2016 1:15 PM Type of Therapy: Group Therapy Participation Level: Active  Participation Quality: Attentive, Sharing and Supportive  Affect: Depressed and Flat  Cognitive: Alert and Oriented  Insight: Developing/Improving and Engaged  Engagement in Therapy: Developing/Improving and Engaged  Modes of Intervention: Activity, Clarification, Confrontation, Discussion, Education, Exploration, Limit-setting, Orientation, Problem-solving, Rapport Building, Reality Testing, Socialization and Support  Summary of Progress/Problems: Patient was attentive and engaged with speaker from [employer]. Patient was attentive to speaker while they shared their story of dealing with mental health and overcoming it. Patient expressed interest in their programs and services and received information on their agency. Patient processed ways they can relate to the speaker.   Javana Schey, LCSW Clinical Social Worker [facility] [phone]   

## 2016-01-07 NOTE — Plan of Care (Signed)
Problem: Safety: Goal: Ability to disclose and discuss suicidal ideas will improve Outcome: Progressing Patient denies SI.  Problem: Medication: Goal: Compliance with prescribed medication regimen will improve Outcome: Progressing Patient med compliant.

## 2016-01-07 NOTE — Progress Notes (Signed)
Psychoeducational Group Note  Date:  01/07/2016 Time:  1000  Group Topic/Focus:  Self Esteem Action Plan:   The focus of this group is to help patients create a plan to continue to build self-esteem after discharge.   Participation Level: Did Not Attend  Participation Quality:  Not Applicable  Affect:  Not Applicable  Cognitive:  Not Applicable  Insight:  Not Applicable  Engagement in Group: Not Applicable  Additional Comments:   Bryan Gardner 01/07/2016, 12:32 PM

## 2016-01-07 NOTE — Progress Notes (Signed)
D:Found patient resting in bed. Spoke with patient 1:1. Rates sleep, appetite, energy and concentration as "good." Patient's affect flat mood depressed. Rating his depression at a 1/10, hopelessness at a 0/10 and anxiety at a 0/10. States goal for today is to "stay happy all day and interact with people." Denies pain, physical problems.   A: Medicated per orders, no prns needed or requested. Emotional support offered and self inventory reviewed. SSP completed and on chart.  R: Patient verbalizes understanding. Patient denies SI/HI and remains safe on level III obs.

## 2016-01-07 NOTE — Tx Team (Signed)
Interdisciplinary Treatment Plan Update (Adult) Date: 01/07/2016    Time Reviewed: 9:30 AM  Progress in Treatment: Attending groups: Continuing to assess, patient new to milieu Participating in groups: Continuing to assess, patient new to milieu Taking medication as prescribed: Yes Tolerating medication: Yes Family/Significant other contact made: No, CSW assessing for appropriate contacts Patient understands diagnosis: Yes Discussing patient identified problems/goals with staff: Yes Medical problems stabilized or resolved: Yes Denies suicidal/homicidal ideation: Yes Issues/concerns per patient self-inventory: Yes Other:  New problem(s) identified: N/A  Discharge Plan or Barriers: Home with outpatient services.   Reason for Continuation of Hospitalization:  Depression Anxiety Medication Stabilization   Comments: N/A  Estimated length of stay: 3-5 days    Patient is a 35 year old male with a diagnosis of Major Depressive Disorder who was admitted following a suicide attempt by hanging himself. Pt reports primary triggers for admission include medical issues and relationship stressors. Patient will benefit from crisis stabilization, medication evaluation, group therapy and psycho education in addition to case management for discharge planning. At discharge, it is recommended that Pt remain compliant with established discharge plan and continued treatment.   Review of initial/current patient goals per problem list:  1. Goal(s): Patient will participate in aftercare plan   Met: Yes   Target date: 3-5 days post admission date   As evidenced by: Patient will participate within aftercare plan AEB aftercare provider and housing plan at discharge being identified.  7/27: Goal met. Patient plans to return home to follow up with outpatient services.    2. Goal (s): Patient will exhibit decreased depressive symptoms and suicidal ideations.   Met: No   Target date: 3-5  days post admission date   As evidenced by: Patient will utilize self rating of depression at 3 or below and demonstrate decreased signs of depression or be deemed stable for discharge by MD.  7/27: Patient admitted with high depression levels and SI attempt.     Attendees: Patient:    Family:    Physician: Dr. Parke Poisson; Dr. Shea Evans; Dr. Modesta Messing 01/07/2016 9:30 AM  Nursing: Mayra Neer, Loletta Specter, RN 01/07/2016 9:30 AM  Clinical Social Worker: Tilden Fossa, LCSW 01/07/2016 9:30 AM  Other: Maxie Better, LCSW  01/07/2016 9:30 AM  Other: Norberto Sorenson, P4CC 01/07/2016 9:30 AM  Other: Lars Pinks, Case Manager 01/07/2016 9:30 AM  Other: Agustina Caroli, May Augustin, NP 01/07/2016 9:30 AM  Other:      Scribe for Treatment Team:  Tilden Fossa, [place]

## 2016-01-07 NOTE — Progress Notes (Signed)
Pt attended karaoke group this evening.  

## 2016-01-07 NOTE — Progress Notes (Signed)
D    Pt is depressed and sad    He did attend karaoke group and came back afterward and went to bed   He tends to isolate and has minimal interaction but he is appropriate A    Verbal support offered   Medications offered    Q 15 min checks R   Pt safe at present

## 2016-01-07 NOTE — H&P (Addendum)
Psychiatric Admission Assessment Adult  Patient Identification: Bryan Gardner MRN:  [id number] Date of Evaluation:  01/07/2016 Chief Complaint:  " I have been having problems " Principal Diagnosis:  Major Depression, No psychotic  Diagnosis:   Patient Active Problem List   Diagnosis Date Noted  . MDD (major depressive disorder), severe ([place]) [F32.2] 01/06/2016   History of Present Illness: 35 year old male, who reports worsening depression in the context of relationship stressors, break up. He attempted suicide by hanging self , which he states was impulsive and in the context of relationship issues.  He states a friend walked in and assisted before he lost consciousness . States that his GF ( with whom he has been living for years, and with whom he has an 80 year old son) left to [place] in June . States " initially I thought it was going to be a brief separation, that I was going to join them there ,  but now she states she needs more time to think about it ". States that she told him she needed more time a few days ago, after which he has been feeling more depressed.  Associated Signs/Symptoms: Depression Symptoms:  depressed mood, suicidal attempt, decreased sense of self esteem  Of note denies anhedonia , denies changes in sleep or appetite  (Hypo) Manic Symptoms:  Denies  Anxiety Symptoms:  Denies  Psychotic Symptoms: Denies  PTSD Symptoms: Denies  Total Time spent with patient: 45 minutes  Past Psychiatric History: denies prior psychiatric admissions, no prior history of suicide attempts, denies history of cutting, denies history of violence, denies history of psychosis, denies history of mania, denies history of anxiety disorder.  Patient states he has had prior depression, primarily when dealing with serious cardiac illness, for which he was started on Zoloft, which he has been tolerating well .  Is the patient at risk to self? Yes.    Has the patient been a risk to self in the  past 6 months? No.  Has the patient been a risk to self within the distant past? No.  Is the patient a risk to others? No.  Has the patient been a risk to others in the past 6 months? No.  Has the patient been a risk to others within the distant past? No.   Prior Inpatient Therapy:  denies  Prior Outpatient Therapy:  denies   Alcohol Screening: 1. How often do you have a drink containing alcohol?: Monthly or less 2. How many drinks containing alcohol do you have on a typical day when you are drinking?: 3 or 4 3. How often do you have six or more drinks on one occasion?: Never Preliminary Score: 1 9. Have you or someone else been injured as a result of your drinking?: No 10. Has a relative or friend or a doctor or another health worker been concerned about your drinking or suggested you cut down?: No Alcohol Use Disorder Identification Test Final Score (AUDIT): 2 Brief Intervention: AUDIT score less than 7 or less-screening does not suggest unhealthy drinking-brief intervention not indicated Substance Abuse History in the last 12 months:  Denies drug or alcohol abuse , states he occasionally smokes cannabis  Consequences of Substance Abuse: Denies  Previous Psychotropic Medications:  States he was on Zoloft in the past , related to depression at the time of cardiac decompensation . Psychological Evaluations:  No  Past Medical History: History of MI and CHF - which occurred at age 12. Past Medical History:  Diagnosis Date  . Myocardial infarction [facility])     Past Surgical History:  Procedure Laterality Date  . CARDIAC SURGERY    . PACEMAKER INSERTION     Family History: parents alive, separated, states he has distant relationship with father, has one brother Family Psychiatric  History:  Denies mental illness/mood disorder in the family, denies suicides in family, father alcoholic  Tobacco Screening: smokes 1/2 PPD  Social History:  35 year old male, single but had been living with GF  for years, recent break up, separation reported as major stressor, has two children, who are currently with their mothers, on disability due to cardiac condition  History  Alcohol Use No     History  Drug Use No    Additional Social History:      Pain Medications: denies Prescriptions: see MAR Over the Counter: denies History of alcohol / drug use?: Yes Longest period of sobriety (when/how long): unknown Name of Substance 1: Cannabis 1 - Age of First Use: 33 1 - Amount (size/oz): unknown 1 - Frequency: 2 x yr 1 - Duration: ongoing 1 - Last Use / Amount: last month Name of Substance 2: Alcohol 2 - Age of First Use: 19 2 - Amount (size/oz): unknown 2 - Frequency: Socially- once per month 2 - Duration: ongoing 2 - Last Use / Amount: last month Name of Substance 3: Nicotine/Cigarettes 3 - Age of First Use: 9 3 - Amount (size/oz): 1 pack 3 - Frequency: daily 3 - Duration: ongoing 3 - Last Use / Amount: yesterday              Allergies:   Allergies  Allergen Reactions  . Bupropion Nausea And Vomiting   Lab Results:  Results for orders placed or performed during the hospital encounter of 01/05/16 (from the past 48 hour(s))  CBC with Differential     Status: Abnormal   Collection Time: 01/05/16  2:50 PM  Result Value Ref Range   WBC 14.9 (H) 4.0 - 10.5 K/uL   RBC 5.08 4.22 - 5.81 MIL/uL   Hemoglobin 14.1 13.0 - 17.0 g/dL   HCT 41.0 39.0 - 52.0 %   MCV 80.7 78.0 - 100.0 fL   MCH 27.8 26.0 - 34.0 pg   MCHC 34.4 30.0 - 36.0 g/dL   RDW 13.7 11.5 - 15.5 %   Platelets 266 150 - 400 K/uL   Neutrophils Relative % 86 %   Neutro Abs 12.9 (H) 1.7 - 7.7 K/uL   Lymphocytes Relative 10 %   Lymphs Abs 1.4 0.7 - 4.0 K/uL   Monocytes Relative 4 %   Monocytes Absolute 0.6 0.1 - 1.0 K/uL   Eosinophils Relative 0 %   Eosinophils Absolute 0.0 0.0 - 0.7 K/uL   Basophils Relative 0 %   Basophils Absolute 0.0 0.0 - 0.1 K/uL  Salicylate level     Status: None   Collection Time:  01/05/16  2:50 PM  Result Value Ref Range   Salicylate Lvl <3.4 2.8 - 30.0 mg/dL  Acetaminophen level     Status: Abnormal   Collection Time: 01/05/16  2:50 PM  Result Value Ref Range   Acetaminophen (Tylenol), Serum <10 (L) 10 - 30 ug/mL    Comment:        THERAPEUTIC CONCENTRATIONS VARY SIGNIFICANTLY. A RANGE OF 10-30 ug/mL MAY BE AN EFFECTIVE CONCENTRATION FOR MANY PATIENTS. HOWEVER, SOME ARE BEST TREATED AT CONCENTRATIONS OUTSIDE THIS RANGE. ACETAMINOPHEN CONCENTRATIONS >150 ug/mL AT 4 HOURS AFTER INGESTION AND >  50 ug/mL AT 12 HOURS AFTER INGESTION ARE OFTEN ASSOCIATED WITH TOXIC REACTIONS.   Comprehensive metabolic panel     Status: Abnormal   Collection Time: 01/05/16  2:50 PM  Result Value Ref Range   Sodium 135 135 - 145 mmol/L   Potassium 3.7 3.5 - 5.1 mmol/L   Chloride 105 101 - 111 mmol/L   CO2 24 22 - 32 mmol/L   Glucose, Bld 114 (H) 65 - 99 mg/dL   BUN 5 (L) 6 - 20 mg/dL   Creatinine, Ser 1.10 0.61 - 1.24 mg/dL   Calcium 9.5 8.9 - 10.3 mg/dL   Total Protein 7.4 6.5 - 8.1 g/dL   Albumin 4.2 3.5 - 5.0 g/dL   AST 21 15 - 41 U/L   ALT 16 (L) 17 - 63 U/L   Alkaline Phosphatase 80 38 - 126 U/L   Total Bilirubin 0.5 0.3 - 1.2 mg/dL   GFR calc non Af Amer >60 >60 mL/min   GFR calc Af Amer >60 >60 mL/min    Comment: (NOTE) The eGFR has been calculated using the CKD EPI equation. This calculation has not been validated in all clinical situations. eGFR's persistently <60 mL/min signify possible Chronic Kidney Disease.    Anion gap 6 5 - 15  Ethanol     Status: None   Collection Time: 01/05/16  2:50 PM  Result Value Ref Range   Alcohol, Ethyl (B) <5 <5 mg/dL    Comment:        LOWEST DETECTABLE LIMIT FOR SERUM ALCOHOL IS 5 mg/dL FOR MEDICAL PURPOSES ONLY   Rapid urine drug screen (hospital performed)     Status: None   Collection Time: 01/05/16  4:20 PM  Result Value Ref Range   Opiates NONE DETECTED NONE DETECTED   Cocaine NONE DETECTED NONE DETECTED    Benzodiazepines NONE DETECTED NONE DETECTED   Amphetamines NONE DETECTED NONE DETECTED   Tetrahydrocannabinol NONE DETECTED NONE DETECTED   Barbiturates NONE DETECTED NONE DETECTED    Comment:        DRUG SCREEN FOR MEDICAL PURPOSES ONLY.  IF CONFIRMATION IS NEEDED FOR ANY PURPOSE, NOTIFY LAB WITHIN 5 DAYS.        LOWEST DETECTABLE LIMITS FOR URINE DRUG SCREEN Drug Class       Cutoff (ng/mL) Amphetamine      1000 Barbiturate      200 Benzodiazepine   700 Tricyclics       174 Opiates          300 Cocaine          300 THC              50     Blood Alcohol level:  Lab Results  Component Value Date   ETH <5 94/49/6759    Metabolic Disorder Labs:  No results found for: HGBA1C, MPG No results found for: PROLACTIN Lab Results  Component Value Date   CHOL  02/18/2010    114        ATP III CLASSIFICATION:  <200     mg/dL   Desirable  200-239  mg/dL   Borderline High  >=240    mg/dL   High          TRIG 109 02/18/2010   HDL 43 02/18/2010   CHOLHDL 2.7 02/18/2010   VLDL 22 02/18/2010   LDLCALC  02/18/2010    49        Total Cholesterol/HDL:CHD Risk Coronary Heart Disease Risk Table  Men   Women  1/2 Average Risk   3.4   3.3  Average Risk       5.0   4.4  2 X Average Risk   9.6   7.1  3 X Average Risk  23.4   11.0        Use the calculated Patient Ratio above and the CHD Risk Table to determine the patient's CHD Risk.        ATP III CLASSIFICATION (LDL):  <100     mg/dL   Optimal  100-129  mg/dL   Near or Above                    Optimal  130-159  mg/dL   Borderline  160-189  mg/dL   High  >190     mg/dL   Very High    Current Medications: Current Facility-Administered Medications  Medication Dose Route Frequency Provider Last Rate Last Dose  . acetaminophen (TYLENOL) tablet 650 mg  650 mg Oral Q6H PRN Nanci Pina, FNP      . alum & mag hydroxide-simeth (MAALOX/MYLANTA) 200-200-20 MG/5ML suspension 30 mL  30 mL Oral Q4H PRN Nanci Pina, FNP      . aspirin EC tablet 81 mg  81 mg Oral Daily Nanci Pina, FNP   81 mg at 01/07/16 3016  . atorvastatin (LIPITOR) tablet 40 mg  40 mg Oral QPM Nanci Pina, FNP   40 mg at 01/06/16 1718  . carvedilol (COREG) tablet 6.25 mg  6.25 mg Oral BID Nanci Pina, FNP   6.25 mg at 01/07/16 0109  . furosemide (LASIX) tablet 40 mg  40 mg Oral BID Nanci Pina, FNP   40 mg at 01/07/16 3235  . hydrOXYzine (ATARAX/VISTARIL) tablet 25 mg  25 mg Oral TID PRN Nanci Pina, FNP      . lisinopril (PRINIVIL,ZESTRIL) tablet 5 mg  5 mg Oral Daily Nanci Pina, FNP   5 mg at 01/07/16 5732  . magnesium hydroxide (MILK OF MAGNESIA) suspension 30 mL  30 mL Oral Daily PRN Nanci Pina, FNP      . nicotine (NICODERM CQ - dosed in mg/24 hours) patch 21 mg  21 mg Transdermal Daily Fernando A Cobos, MD      . nitroGLYCERIN (NITROSTAT) SL tablet 0.4 mg  0.4 mg Sublingual Q5 min PRN Nanci Pina, FNP      . sertraline (ZOLOFT) tablet 50 mg  50 mg Oral Daily Nanci Pina, FNP   50 mg at 01/07/16 2025  . spironolactone (ALDACTONE) tablet 25 mg  25 mg Oral Daily Nanci Pina, FNP   25 mg at 01/07/16 4270  . traZODone (DESYREL) tablet 50 mg  50 mg Oral QHS,MR X 1 Nanci Pina, FNP       PTA Medications: Prescriptions Prior to Admission  Medication Sig Dispense Refill Last Dose  . aspirin EC 81 MG tablet Take 81 mg by mouth daily.   01/04/2016 at am  . atorvastatin (LIPITOR) 40 MG tablet Take 40 mg by mouth every evening.   01/04/2016 at pm  . carvedilol (COREG) 6.25 MG tablet Take 6.25 mg by mouth 2 (two) times daily.   01/04/2016 at pm  . furosemide (LASIX) 40 MG tablet Take 40 mg by mouth 2 (two) times daily.   01/04/2016 at pm  . lisinopril (PRINIVIL,ZESTRIL) 5 MG tablet Take 5 mg by mouth daily.   01/04/2016  at am  . nitroGLYCERIN (NITROSTAT) 0.4 MG SL tablet Place 0.4 mg under the tongue every 5 (five) minutes as needed for chest pain.     Marland Kitchen sertraline (ZOLOFT) 50 MG tablet Take 50  mg by mouth daily.   01/04/2016 at am  . spironolactone (ALDACTONE) 25 MG tablet Take 25 mg by mouth daily.   01/04/2016 at am    Musculoskeletal: Strength & Muscle Tone: within normal limits Gait & Station: normal Patient leans: N/A  Psychiatric Specialty Exam: Physical Exam  Review of Systems  Constitutional: Negative.   HENT: Positive for ear pain.   Eyes: Negative.   Respiratory: Negative.   Cardiovascular: Negative.   Gastrointestinal: Negative.   Genitourinary: Negative.   Musculoskeletal: Negative.   Skin: Negative.   Neurological: Negative for seizures.  Endo/Heme/Allergies: Negative.   Psychiatric/Behavioral: Positive for depression and suicidal ideas.  All other systems reviewed and are negative.   Blood pressure 108/75, pulse 61, temperature 98.5 F (36.9 C), temperature source Oral, resp. rate 20, height _0  (1.803 m), weight 190 lb (86.2 kg).Body mass index is 26.5 kg/m.  General Appearance: Fairly Groomed  Eye Contact:  Good  Speech:  Normal Rate  Volume:  Decreased  Mood:  depressed, but states he is feeling better   Affect:  states feeling better, but still presents depressed   Thought Process:  Linear  Orientation:  Full (Time, Place, and Person)  Thought Content:  denies hallucinations, no delusions, not internally preoccupied   Suicidal Thoughts:  No- denies any current suicidal or self injurious ideations and contracts for safety   Homicidal Thoughts:  No- denies any homicidal ideations   Memory:  recent and remote grossly intact   Judgement:  Fair  Insight:  Fair  Psychomotor Activity:  Decreased  Concentration:  Concentration: Good and Attention Span: Good  Recall:  Good  Fund of Knowledge:  Good  Language:  Good  Akathisia:  Negative  Handed:  Right  AIMS (if indicated):     Assets:  Desire for Improvement Resilience  ADL's:  Intact  Cognition:  WNL  Sleep:  Number of Hours: 6.25       Treatment Plan Summary: Daily contact with  patient to assess and evaluate symptoms and progress in treatment, Medication management, Plan inpatient admission  and medications as below   Observation Level/Precautions:  15 minute checks  Laboratory:  as needed  TSH- 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    Neita Garnet, MD 7/27/201711:16 AM

## 2016-01-07 NOTE — [facility] Suicide Risk Assessment (Signed)
[facility] Admission Suicide Risk Assessment   Nursing information obtained from:  Patient Demographic factors:  Male, Caucasian, Divorced or widowed Current Mental Status:  NA Loss Factors:  Loss of significant relationship Historical Factors:  Impulsivity Risk Reduction Factors:  Positive social support  Total Time spent with patient: 45 minutes Principal Problem:  Suicide attempt  Diagnosis:   Patient Active Problem List   Diagnosis Date Noted  . MDD (major depressive disorder), severe ([facility]) [F32.2] 01/06/2016     Continued Clinical Symptoms:  Alcohol Use Disorder Identification Test Final Score (AUDIT): 2 The "Alcohol Use Disorders Identification Test", Guidelines for Use in Primary Care, Second Edition.  World Science writer [facility]). Score between 0-7:  no or low risk or alcohol related problems. Score between 8-15:  moderate risk of alcohol related problems. Score between 16-19:  high risk of alcohol related problems. Score 20 or above:  warrants further diagnostic evaluation for alcohol dependence and treatment.   CLINICAL FACTORS:  35 year old man, reports worsening depression in the context of broken relationship  With GF and separation from son, attempted to hang self prior to admission     Psychiatric Specialty Exam: Physical Exam  ROS  Blood pressure 108/75, pulse 61, temperature 98.5 F (36.9 C), temperature source Oral, resp. rate 20, height 5\' 11"  (1.803 m), weight 190 lb (86.2 kg).Body mass index is 26.5 kg/m.   see admit note MSE    COGNITIVE FEATURES THAT CONTRIBUTE TO RISK:  Closed-mindedness and Loss of executive function    SUICIDE RISK:   Moderate:  Frequent suicidal ideation with limited intensity, and duration, some specificity in terms of plans, no associated intent, good self-control, limited dysphoria/symptomatology, some risk factors present, and identifiable protective factors, including available and accessible social support.   PLAN OF CARE:  Patient will be admitted to inpatient psychiatric unit for stabilization and safety. Will provide and encourage milieu participation. Provide medication management and maked adjustments as needed.  Will follow daily.    I certify that inpatient services furnished can reasonably be expected to improve the patient's condition.  Nehemiah Massed, MD 01/07/2016, 11:40 AM

## 2016-01-07 NOTE — [facility] Suicide Risk Assessment (Signed)
[facility] INPATIENT:  Family/Significant Other Suicide Prevention Education  Suicide Prevention Education:  Patient Refusal for Family/Significant Other Suicide Prevention Education: The patient Bryan Gardner has refused to provide written consent for family/significant other to be provided Family/Significant Other Suicide Prevention Education during admission and/or prior to discharge.  Physician notified. SPE reviewed with patient and brochure provided. Patient encouraged to return to hospital if having suicidal thoughts, patient verbalized his/her understanding and has no further questions at this time.   [name], [name] 01/07/2016, 3:38 PM

## 2016-01-07 NOTE — [facility] Group Notes (Signed)
[facility] Group Notes:  (Nursing/MHT/Case Management/Adjunct)  Date:  01/07/2016  Time:  9:36 AM  Type of Therapy:  Psychoeducational Skills  Participation Level:  Did Not Attend  Patient invited; declined to attend.  [place] Mon 01/07/2016, 9:36 AM

## 2016-01-08 LAB — TSH: TSH: 5.149 u[IU]/mL — AB (ref 0.350–4.500)

## 2016-01-08 NOTE — Progress Notes (Signed)
The focus of this group is to help patients review their daily goal of treatment and discuss progress on daily workbooks.  Patient attended group this evening and stated that his goal of the day was to socialize, attend group and work on discharge plan. Patient rated his day as a 10/20

## 2016-01-08 NOTE — Progress Notes (Signed)
D: Patient is sleeping well.  He states his depressive symptoms have decreased.  He rates his hopelessness, depression and anxiety as 0.  He denies any self harm thoughts or behaviors.  He denies HI/AVH.  His goal today is to "socialize and attend groups."  Patient is compliant with medications and attending some groups on the unit. A: Continue to monitor medication management and MD orders.  Safety checks completed every 15 minutes per protocol.  Offer support and encouragement as needed. R: Patient is receptive to staff; his behavior is appropriate.

## 2016-01-08 NOTE — Progress Notes (Signed)
[facility] MD Progress Note  01/08/2016 3:02 PM Bryan Gardner  MRN:  [id number] Subjective:  Patient reports feeling better today, less depressed .  States he had a phone conversation with his son yesterday which made him feel happy to be alive. Denies any lingering or ongoing suicidal ideations at this time . Objective : I have discussed case with treatment team and have met with patient . Staff reports/chart notes indicate that patient presents with sad, constricted affect.  He reports  improving mood and  has a slightly improved range of affect, although still constricted. Denies suicidal ideations. Visible on unit, no disruptive or agitated behaviors. Tolerating Zoloft well thus far. Patient has history of CAD, MI, pacemaker- at this time denies any chest pain, discomfort, no shortness of breath TSH is elevated . ( 5.149)  Principal Problem: MDD  Diagnosis:   Patient Active Problem List   Diagnosis Date Noted  . MDD (major depressive disorder), severe ([place]) [F32.2] 01/06/2016   Total Time spent with patient: 20 minutes    Past Medical History:  Past Medical History:  Diagnosis Date  . Myocardial infarction [facility])     Past Surgical History:  Procedure Laterality Date  . CARDIAC SURGERY    . PACEMAKER INSERTION     Family History: History reviewed. No pertinent family history.  Social History:  History  Alcohol Use No     History  Drug Use No    Social History   Social History  . Marital status: Single    Spouse name: N/A  . Number of children: N/A  . Years of education: N/A   Social History Main Topics  . Smoking status: Current Every Day Smoker    Packs/day: 1.00    Types: Cigarettes  . Smokeless tobacco: Never Used  . Alcohol use No  . Drug use: No  . Sexual activity: Not Currently   Other Topics Concern  . None   Social History Narrative  . None   Additional Social History:    Pain Medications: denies Prescriptions: see MAR Over the Counter:  denies History of alcohol / drug use?: Yes Longest period of sobriety (when/how long): unknown Name of Substance 1: Cannabis 1 - Age of First Use: 9 1 - Amount (size/oz): unknown 1 - Frequency: 2 x yr 1 - Duration: ongoing 1 - Last Use / Amount: last month Name of Substance 2: Alcohol 2 - Age of First Use: 19 2 - Amount (size/oz): unknown 2 - Frequency: Socially- once per month 2 - Duration: ongoing 2 - Last Use / Amount: last month Name of Substance 3: Nicotine/Cigarettes 3 - Age of First Use: 9 3 - Amount (size/oz): 1 pack 3 - Frequency: daily 3 - Duration: ongoing 3 - Last Use / Amount: yesterday  Sleep: Good  Appetite:  Good  Current Medications: Current Facility-Administered Medications  Medication Dose Route Frequency Provider Last Rate Last Dose  . acetaminophen (TYLENOL) tablet 650 mg  650 mg Oral Q6H PRN Nanci Pina, FNP      . alum & mag hydroxide-simeth (MAALOX/MYLANTA) 200-200-20 MG/5ML suspension 30 mL  30 mL Oral Q4H PRN Nanci Pina, FNP      . aspirin EC tablet 81 mg  81 mg Oral Daily Nanci Pina, FNP   81 mg at 01/08/16 0755  . atorvastatin (LIPITOR) tablet 40 mg  40 mg Oral QPM Nanci Pina, FNP   40 mg at 01/07/16 1719  . carvedilol (COREG) tablet 6.25 mg  6.25 mg Oral BID Nanci Pina, FNP   6.25 mg at 01/08/16 0755  . furosemide (LASIX) tablet 40 mg  40 mg Oral BID Nanci Pina, FNP   40 mg at 01/08/16 0755  . hydrOXYzine (ATARAX/VISTARIL) tablet 25 mg  25 mg Oral TID PRN Nanci Pina, FNP      . lisinopril (PRINIVIL,ZESTRIL) tablet 5 mg  5 mg Oral Daily Nanci Pina, FNP   5 mg at 01/08/16 0755  . magnesium hydroxide (MILK OF MAGNESIA) suspension 30 mL  30 mL Oral Daily PRN Nanci Pina, FNP      . nicotine (NICODERM CQ - dosed in mg/24 hours) patch 21 mg  21 mg Transdermal Daily Fernando A Cobos, MD      . nitroGLYCERIN (NITROSTAT) SL tablet 0.4 mg  0.4 mg Sublingual Q5 min PRN Nanci Pina, FNP      . sertraline  (ZOLOFT) tablet 100 mg  100 mg Oral Daily Jenne Campus, MD   100 mg at 01/08/16 0755  . spironolactone (ALDACTONE) tablet 25 mg  25 mg Oral Daily Nanci Pina, FNP   25 mg at 01/08/16 0755  . traZODone (DESYREL) tablet 50 mg  50 mg Oral QHS PRN Jenne Campus, MD        Lab Results:  Results for orders placed or performed during the hospital encounter of 01/06/16 (from the past 48 hour(s))  TSH     Status: Abnormal   Collection Time: 01/08/16  6:44 AM  Result Value Ref Range   TSH 5.149 (H) 0.350 - 4.500 uIU/mL    Comment: Performed at [facility]    Blood Alcohol level:  Lab Results  Component Value Date   [facility] <5 28/31/5176    Metabolic Disorder Labs: No results found for: HGBA1C, MPG No results found for: PROLACTIN Lab Results  Component Value Date   CHOL  02/18/2010    114        ATP III CLASSIFICATION:  <200     mg/dL   Desirable  200-239  mg/dL   Borderline High  >=240    mg/dL   High          TRIG 109 02/18/2010   HDL 43 02/18/2010   CHOLHDL 2.7 02/18/2010   VLDL 22 02/18/2010   LDLCALC  02/18/2010    49        Total Cholesterol/HDL:CHD Risk Coronary Heart Disease Risk Table                     Men   Women  1/2 Average Risk   3.4   3.3  Average Risk       5.0   4.4  2 X Average Risk   9.6   7.1  3 X Average Risk  23.4   11.0        Use the calculated Patient Ratio above and the CHD Risk Table to determine the patient's CHD Risk.        ATP III CLASSIFICATION (LDL):  <100     mg/dL   Optimal  100-129  mg/dL   Near or Above                    Optimal  130-159  mg/dL   Borderline  160-189  mg/dL   High  >190     mg/dL   Very High    Physical Findings: AIMS: Facial and Oral  Movements Muscles of Facial Expression: None, normal Lips and Perioral Area: None, normal Jaw: None, normal Tongue: None, normal,Extremity Movements Upper (arms, wrists, hands, fingers): None, normal Lower (legs, knees, ankles, toes): None, normal,  Trunk Movements Neck, shoulders, hips: None, normal, Overall Severity Severity of abnormal movements (highest score from questions above): None, normal Incapacitation due to abnormal movements: None, normal Patient's awareness of abnormal movements (rate only patient's report): No Awareness, Dental Status Current problems with teeth and/or dentures?: No Does patient usually wear dentures?: No  CIWA:    COWS:     Musculoskeletal: Strength & Muscle Tone: within normal limits Gait & Station: normal Patient leans: N/A  Psychiatric Specialty Exam: Physical Exam  ROS no headache, no chest pain, no shortness of breath, no vomiting, no rash   Blood pressure 123/78, pulse (!) 50, temperature 98.6 F (37 C), temperature source Oral, resp. rate 18, height '5\' 11"'$  (1.803 m), weight 190 lb (86.2 kg).Body mass index is 26.5 kg/m.  General Appearance: Fairly Groomed  Eye Contact:  Good  Speech:  Normal Rate  Volume:  Normal  Mood:  reports feeling better, less depressed   Affect:  still constricted, subdued , but improving compared to admission   Thought Process:  Linear  Orientation:  Full (Time, Place, and Person)  Thought Content:  denies hallucinations, no delusions, not internally preoccupied   Suicidal Thoughts:  No- denies any suicidal ideations, denies any self injurious ideations at this time  Homicidal Thoughts:  No- denies any homicidal ideations   Memory:  recent and remote grossly intact   Judgement:  Other:  improving   Insight:  improving   Psychomotor Activity:  Normal  Concentration:  Concentration: Good and Attention Span: Good  Recall:  Good  Fund of Knowledge:  Good  Language:  Good  Akathisia:  No  Handed:  Right  AIMS (if indicated):     Assets:  Desire for Improvement Resilience  ADL's:  Intact  Cognition:  WNL  Sleep:  Number of Hours: 6   Assessment- at this time patient is presenting with improved mood and range of affect compared to admission, but still  presents subdued, sad. Denies suicidal ideations, and today identifies lover for his child as a protective factor against considering suicide in the future . TSH slightly elevated . Thus far tolerating Zoloft well , recently titrated to 100 mgrs daily.  Treatment Plan Summary: Daily contact with patient to assess and evaluate symptoms and progress in treatment, Medication management, Plan inpatient treatment  and medications as below   Continue group, milieu participation to work on coping skills and symptom reduction Continue Zoloft 100 mgrs QDAY for depression, anxiety  Continue Vistaril 25 mgrs Q 8 hours PRN for anxiety  Continue Trazodone 50 mgrs QHS PRN for insomnia Check T3/T4 levels to follow up on elevated TSH  COBOS, Felicita Gage, MD 01/08/2016, 3:02 PM

## 2016-01-08 NOTE — Plan of Care (Signed)
Problem: Coping: Goal: Ability to verbalize feelings will improve Outcome: Progressing Patient is able to verbalize his feelings well with staff.

## 2016-01-08 NOTE — [facility] Group Notes (Signed)
  [facility] LCSW Group Therapy 01/08/2016  1:15 pm   Type of Therapy: Group Therapy Participation Level: Minimal  Participation Quality: Attentive  Affect: Blunted  Cognitive: Alert and Oriented  Insight: Developing/Improving and Engaged  Engagement in Therapy: Developing/Improving and Engaged  Modes of Intervention: Clarification, Confrontation, Discussion, Education, Exploration, Limit-setting, Orientation, Problem-solving, Rapport Building, Dance movement psychotherapist, Socialization and Support  Summary of Progress/Problems: The topic for group was balance in life. Today's group focused on defining balance in one's own words, identifying things that can knock one off balance, and exploring healthy ways to maintain balance in life. Group members were asked to provide an example of a time when they felt off balance, describe how they handled that situation,and process healthier ways to regain balance in the future. Group members were asked to share the most important tool for maintaining balance that they learned while at [facility] and how they plan to apply this method after discharge. Patient participated minimally in group discussion despite CSW encouragement.   Samuella Bruin, MSW, LCSW Clinical Social Worker [facility] [phone]

## 2016-01-09 DIAGNOSIS — F322 Major depressive disorder, single episode, severe without psychotic features: Principal | ICD-10-CM

## 2016-01-09 LAB — T4, FREE: Free T4: 1.24 ng/dL — ABNORMAL HIGH (ref 0.61–1.12)

## 2016-01-09 NOTE — Progress Notes (Signed)
D   Pt is calm and cooperative   He interacts appropriately but minimally with his peers   He is complaint with treatment A   Verbal support given   Medications offered   Q 15 min checks R   Pt is safe at present

## 2016-01-09 NOTE — Progress Notes (Signed)
[facility] MD Progress Note  01/09/2016 12:36 PM Bryan Gardner  MRN:  [id number] Subjective:  Patient " Im good feeling better. I was able to play some card games with people yesterday. I feel better when I talk to people. I talked to my friend and I am supposed to be moving to [place] so that was some good news yesterday. " Denies any lingering or ongoing suicidal ideations at this time.  Per nursing: Patient is sleeping well.  He states his depressive symptoms have decreased.  He rates his hopelessness, depression and anxiety as 0.  He denies any self harm thoughts or behaviors.  He denies HI/AVH.  His goal today is to "socialize and attend groups."  Patient is compliant with medications and attending some groups on the unit.  Continue to monitor medication management and MD orders.  Safety checks completed every 15 minutes per protocol.  Offer support and encouragement as needed.  Objective : I have discussed case with staff and have met with patient . Staff reports/chart notes indicate that patient presents with sad, constricted affect.  He reports  improving mood and  has a slightly improved range of affect, although still constricted. Denies suicidal ideations, homicidal ideation hallucinations and psychosis at this time. Visible on unit, no disruptive or agitated behaviors. He is attending groups and his goal today is to work on Radiographer, therapeutic for depression.  Tolerating Zoloft well thus far, denies any side effects at this time.  Patient has history of CAD, MI, pacemaker- at this time denies any chest pain, discomfort, no shortness of breath TSH is elevated . ( 5.149)  Principal Problem: MDD  Diagnosis:   Patient Active Problem List   Diagnosis Date Noted  . MDD (major depressive disorder), severe ([place]) [F32.2] 01/06/2016   Total Time spent with patient: 20 minutes    Past Medical History:  Past Medical History:  Diagnosis Date  . Myocardial infarction [facility])     Past Surgical History:   Procedure Laterality Date  . CARDIAC SURGERY    . PACEMAKER INSERTION     Family History: History reviewed. No pertinent family history.  Social History:  History  Alcohol Use No     History  Drug Use No    Social History   Social History  . Marital status: Single    Spouse name: N/A  . Number of children: N/A  . Years of education: N/A   Social History Main Topics  . Smoking status: Current Every Day Smoker    Packs/day: 1.00    Types: Cigarettes  . Smokeless tobacco: Never Used  . Alcohol use No  . Drug use: No  . Sexual activity: Not Currently   Other Topics Concern  . None   Social History Narrative  . None   Additional Social History:    Pain Medications: denies Prescriptions: see MAR Over the Counter: denies History of alcohol / drug use?: Yes Longest period of sobriety (when/how long): unknown Name of Substance 1: Cannabis 1 - Age of First Use: 33 1 - Amount (size/oz): unknown 1 - Frequency: 2 x yr 1 - Duration: ongoing 1 - Last Use / Amount: last month Name of Substance 2: Alcohol 2 - Age of First Use: 19 2 - Amount (size/oz): unknown 2 - Frequency: Socially- once per month 2 - Duration: ongoing 2 - Last Use / Amount: last month Name of Substance 3: Nicotine/Cigarettes 3 - Age of First Use: 9 3 - Amount (size/oz): 1 pack 3 -  Frequency: daily 3 - Duration: ongoing 3 - Last Use / Amount: yesterday  Sleep: Good  Appetite:  Good  Current Medications: Current Facility-Administered Medications  Medication Dose Route Frequency Provider Last Rate Last Dose  . acetaminophen (TYLENOL) tablet 650 mg  650 mg Oral Q6H PRN Nanci Pina, FNP      . alum & mag hydroxide-simeth (MAALOX/MYLANTA) 200-200-20 MG/5ML suspension 30 mL  30 mL Oral Q4H PRN Nanci Pina, FNP      . aspirin EC tablet 81 mg  81 mg Oral Daily Nanci Pina, FNP   81 mg at 01/09/16 0910  . atorvastatin (LIPITOR) tablet 40 mg  40 mg Oral QPM Nanci Pina, FNP   40 mg at  01/08/16 1718  . carvedilol (COREG) tablet 6.25 mg  6.25 mg Oral BID Nanci Pina, FNP   6.25 mg at 01/09/16 9326  . furosemide (LASIX) tablet 40 mg  40 mg Oral BID Nanci Pina, FNP   40 mg at 01/09/16 0910  . hydrOXYzine (ATARAX/VISTARIL) tablet 25 mg  25 mg Oral TID PRN Nanci Pina, FNP      . lisinopril (PRINIVIL,ZESTRIL) tablet 5 mg  5 mg Oral Daily Nanci Pina, FNP   5 mg at 01/09/16 0910  . magnesium hydroxide (MILK OF MAGNESIA) suspension 30 mL  30 mL Oral Daily PRN Nanci Pina, FNP      . nicotine (NICODERM CQ - dosed in mg/24 hours) patch 21 mg  21 mg Transdermal Daily Fernando A Cobos, MD      . nitroGLYCERIN (NITROSTAT) SL tablet 0.4 mg  0.4 mg Sublingual Q5 min PRN Nanci Pina, FNP      . sertraline (ZOLOFT) tablet 100 mg  100 mg Oral Daily Jenne Campus, MD   100 mg at 01/09/16 0910  . spironolactone (ALDACTONE) tablet 25 mg  25 mg Oral Daily Nanci Pina, FNP   25 mg at 01/09/16 0910  . traZODone (DESYREL) tablet 50 mg  50 mg Oral QHS PRN Jenne Campus, MD        Lab Results:  Results for orders placed or performed during the hospital encounter of 01/06/16 (from the past 48 hour(s))  TSH     Status: Abnormal   Collection Time: 01/08/16  6:44 AM  Result Value Ref Range   TSH 5.149 (H) 0.350 - 4.500 uIU/mL    Comment: Performed at [facility]  T4, free     Status: Abnormal   Collection Time: 01/09/16  6:20 AM  Result Value Ref Range   Free T4 1.24 (H) 0.61 - 1.12 ng/dL    Comment: (NOTE) Biotin ingestion may interfere with free T4 tests. If the results are inconsistent with the TSH level, previous test results, or the clinical presentation, then consider biotin interference. If needed, order repeat testing after stopping biotin. Performed at [facility]     Blood Alcohol level:  Lab Results  Component Value Date   [facility] <5 71/24/5809    Metabolic Disorder Labs: No results found for: HGBA1C, MPG No results  found for: PROLACTIN Lab Results  Component Value Date   CHOL  02/18/2010    114        ATP III CLASSIFICATION:  <200     mg/dL   Desirable  200-239  mg/dL   Borderline High  >=240    mg/dL   High          TRIG  109 02/18/2010   HDL 43 02/18/2010   CHOLHDL 2.7 02/18/2010   VLDL 22 02/18/2010   LDLCALC  02/18/2010    49        Total Cholesterol/HDL:CHD Risk Coronary Heart Disease Risk Table                     Men   Women  1/2 Average Risk   3.4   3.3  Average Risk       5.0   4.4  2 X Average Risk   9.6   7.1  3 X Average Risk  23.4   11.0        Use the calculated Patient Ratio above and the CHD Risk Table to determine the patient's CHD Risk.        ATP III CLASSIFICATION (LDL):  <100     mg/dL   Optimal  933-882  mg/dL   Near or Above                    Optimal  130-159  mg/dL   Borderline  666-648  mg/dL   High  >616     mg/dL   Very High    Physical Findings: AIMS: Facial and Oral Movements Muscles of Facial Expression: None, normal Lips and Perioral Area: None, normal Jaw: None, normal Tongue: None, normal,Extremity Movements Upper (arms, wrists, hands, fingers): None, normal Lower (legs, knees, ankles, toes): None, normal, Trunk Movements Neck, shoulders, hips: None, normal, Overall Severity Severity of abnormal movements (highest score from questions above): None, normal Incapacitation due to abnormal movements: None, normal Patient's awareness of abnormal movements (rate only patient's report): No Awareness, Dental Status Current problems with teeth and/or dentures?: No Does patient usually wear dentures?: No  CIWA:    COWS:     Musculoskeletal: Strength & Muscle Tone: within normal limits Gait & Station: normal Patient leans: N/A  Psychiatric Specialty Exam: Physical Exam   ROS  no headache, no chest pain, no shortness of breath, no vomiting, no rash   Blood pressure 116/79, pulse (!) 51, temperature 98 F (36.7 C), temperature source Oral,  resp. rate 12, height 5\' 11"  (1.803 m), weight 86.2 kg (190 lb).Body mass index is 26.5 kg/m.  General Appearance: Fairly Groomed  Eye Contact:  Good  Speech:  Normal Rate  Volume:  Normal  Mood:  reports feeling better, less depressed   Affect:  still constricted, subdued , but improving compared to admission   Thought Process:  Linear  Orientation:  Full (Time, Place, and Person)  Thought Content:  denies hallucinations, no delusions, not internally preoccupied   Suicidal Thoughts:  No- denies any suicidal ideations, denies any self injurious ideations at this time  Homicidal Thoughts:  No- denies any homicidal ideations   Memory:  recent and remote grossly intact   Judgement:  Other:  improving   Insight:  improving   Psychomotor Activity:  Normal  Concentration:  Concentration: Good and Attention Span: Good  Recall:  Good  Fund of Knowledge:  Good  Language:  Good  Akathisia:  No  Handed:  Right  AIMS (if indicated):     Assets:  Desire for Improvement Resilience  ADL's:  Intact  Cognition:  WNL  Sleep:  Number of Hours: 6   Assessment- at this time patient is presenting with improved mood and range of affect compared to admission, but still presents subdued, sad. Denies suicidal ideations, and today identifies lover for his child  as a protective factor against considering suicide in the future . TSH slightly elevated . Thus far tolerating Zoloft well , recently titrated to 100 mgrs daily.  Treatment Plan Summary: Daily contact with patient to assess and evaluate symptoms and progress in treatment, Medication management, Plan inpatient treatment  and medications as below   Continue group, milieu participation to work on coping skills and symptom reduction Continue Zoloft 100 mgrs QDAY for depression, anxiety  Continue Vistaril 25 mgrs Q 8 hours PRN for anxiety  Continue Trazodone 50 mgrs QHS PRN for insomnia Check T3  Pending,, T4 1.24 levels to follow up on elevated TSH   Nanci Pina, FNP 01/09/2016, 12:36 PM  Patient seen face-to-face for psychiatric evaluation, chart reviewed and case discussed with the physician extender and developed treatment plan. Reviewed the information documented and agree with the treatment plan. Corena Pilgrim, MD

## 2016-01-09 NOTE — [facility] Group Notes (Signed)
[facility] Group Notes:  (Nursing/MHT/Case Management/Adjunct)  Date:  01/09/2016  Time:  0900  Type of Therapy:  Goals Group  /  The grup focuses on teaching patients how to identify daily goals that are attainable and that will help them maintain their recovery.  Participation Level:  Active  Participation Quality:  Attentive  Affect:  Appropriate  Cognitive:  Alert  Insight:  Appropriate  Engagement in Group:  Engaged  Modes of Intervention:  Education  Summary of Progress/Problems:  Bryan Gardner 01/09/2016, 12:56 PM

## 2016-01-09 NOTE — [facility] Group Notes (Signed)
[facility] LCSW Group Therapy  01/09/2016 10:00 AM  Type of Therapy:  Group Therapy  Participation Level:  Minimal  Participation Quality:  Attentive  Affect:  Appropriate  Cognitive:  Alert and Oriented  Insight:  Limited  Engagement in Therapy:  Limited  Modes of Intervention:  Discussion  Summary of Progress/Problems: Group today was about managing mood and emotions using coping skills and community treatment resources. Patient identified that they wanted to discuss mood swings, hopelessness and recovery. Patient identified that these challenges make progress very difficult. Group was able to process Acceptance of challenges with mood and emotions, Empowerment to deal with these challenges and Utilization of resources to create an effective life balance despite these challenges. Patient offered little feedback throughout group. Patient stated that he does seek to maintain positive mood and stay happy.   Beverly Sessions 01/09/2016, 3:55 PM

## 2016-01-09 NOTE — Progress Notes (Signed)
The focus of this group is to help patients review their daily goal of treatment and discuss progress on daily workbooks.  Patient attended group and states that his goal was to socialize and stay happy. Patient states that he has interacted more.  Patient state that his day was a 10/10.

## 2016-01-10 NOTE — Progress Notes (Signed)
Patient has been up and active on the unit, attended group this evening and has voiced no complaints. Patient currently denies having pain, -si/hi/a/v hall. Support and encouragement offered, safety maintained on unit, will continue to monitor.  

## 2016-01-10 NOTE — Progress Notes (Signed)
Adult Psychoeducational Group Note  Date:  01/10/2016 Time:  1100 am  Group Topic/Focus:  Healthy support systems/life skills  Participation Level:  Active  Participation Quality:  Appropriate  Affect:  Appropriate  Cognitive:  Appropriate  Insight: Appropriate  Engagement in Group:  Engaged  Modes of Intervention:  Discussion and Education  Additional Comments:    Sorayah Schrodt L 01/10/2016, 2:45 PM

## 2016-01-10 NOTE — [facility] Group Notes (Signed)
[facility] LCSW Group Therapy  01/10/2016 10:00 AM  Type of Therapy:  Group Therapy  Participation Level:  Minimal  Participation Quality:  Appropriate  Affect:  Appropriate  Cognitive:  Alert and Oriented  Insight:  Improving  Engagement in Therapy:  Improving  Modes of Intervention:  Discussion  Summary of Progress/Problems: Group was focused on meditation and relaxation. Patient participated in progressive meditation exercise. Patients then had the opportunity to reflect on the relaxation exercise and discuss the feelings of tension versus relaxation. Patients were also able to share with colleagues other things they do to help relax. Patients also shared things that make relaxation and centering difficult. Facilitator lead discussion on acceptance in order to manage stress and anxiety that prevent relaxation. Patient stated that he had benefited from the relaxation activity.   Beverly Sessions 01/10/2016, 3:07 PM

## 2016-01-10 NOTE — Progress Notes (Signed)
D: Pt presents with flat affect. Pt denies depression, anxiety and suicidal thoughts. Pt expressed that he continues to work on triggers for his depression. Pt verbalized that one trigger is him thinking about his wife who he's separated from at this time. Pt appears well groomed. Pt compliant with attending groups and taking meds.  No side effects to meds verbalized by pt. A: medications reviewed with pt. Medications administered as ordered per MD. Verbal support provided. Pt encouraged to attend groups. 15 minute checks performed for safety. R: Pt receptive to tx. Pt stated goal is to attend groups. Pt verbalizes understanding of med regimen.

## 2016-01-10 NOTE — Progress Notes (Signed)
Adult Psychoeducational Group Note  Date:  01/10/2016 Time:  0900 am  Group Topic/Focus:  Healthy Support Systems  Participation Level:  Active  Participation Quality:  Appropriate  Affect:  Appropriate  Cognitive:  Appropriate  Insight: Appropriate  Engagement in Group:  Engaged  Modes of Intervention:  Discussion and Education  Additional Comments:    Markeesha Char L 01/10/2016, 11:14 AM

## 2016-01-10 NOTE — Progress Notes (Signed)
[facility] MD Progress Note  01/10/2016 12:36 PM Bryan Gardner  MRN:  [id number]   Subjective:  Patient " Im good. Still interacting with my peers. I noticed when I am talking to people versus when I am alone. My mind does not wonder as much. I tender to get more depressed when I am alone, and that is what happened last time. I was in a dark space in dark room. I want to start focusing more on myself and kids, then a relationship. "  Per nursing: Patient has been up and active on the unit, attended group this evening and has voiced no complaints. Patient currently denies having pain, -si/hi/a/v hall. Support and encouragement offered, safety maintained on unit, will continue to monitor.  Objective : I have discussed case with staff and have met with patient . Staff reports/chart notes indicate that patient continues to show improvement daily.  He reports  improving mood and  has a slightly improved range of affect. He is working today on discharge planning, and self-empowerment. Denies suicidal ideations, homicidal ideation hallucinations and psychosis at this time. Visible on unit, no disruptive or agitated behaviors. He is attending groups and his goal today is to work on Radiographer, therapeutic for depression.  Tolerating Zoloft well thus far, denies any side effects at this time.   Principal Problem: MDD  Diagnosis:   Patient Active Problem List   Diagnosis Date Noted  . MDD (major depressive disorder), severe ([place]) [F32.2] 01/06/2016   Total Time spent with patient: 20 minutes    Past Medical History:  Past Medical History:  Diagnosis Date  . Myocardial infarction [facility])     Past Surgical History:  Procedure Laterality Date  . CARDIAC SURGERY    . PACEMAKER INSERTION     Family History: History reviewed. No pertinent family history.  Social History:  History  Alcohol Use No     History  Drug Use No    Social History   Social History  . Marital status: Single    Spouse name: N/A  .  Number of children: N/A  . Years of education: N/A   Social History Main Topics  . Smoking status: Current Every Day Smoker    Packs/day: 1.00    Types: Cigarettes  . Smokeless tobacco: Never Used  . Alcohol use No  . Drug use: No  . Sexual activity: Not Currently   Other Topics Concern  . None   Social History Narrative  . None   Additional Social History:    Pain Medications: denies Prescriptions: see MAR Over the Counter: denies History of alcohol / drug use?: Yes Longest period of sobriety (when/how long): unknown Name of Substance 1: Cannabis 1 - Age of First Use: 73 1 - Amount (size/oz): unknown 1 - Frequency: 2 x yr 1 - Duration: ongoing 1 - Last Use / Amount: last month Name of Substance 2: Alcohol 2 - Age of First Use: 19 2 - Amount (size/oz): unknown 2 - Frequency: Socially- once per month 2 - Duration: ongoing 2 - Last Use / Amount: last month Name of Substance 3: Nicotine/Cigarettes 3 - Age of First Use: 9 3 - Amount (size/oz): 1 pack 3 - Frequency: daily 3 - Duration: ongoing 3 - Last Use / Amount: yesterday  Sleep: Good  Appetite:  Good  Current Medications: Current Facility-Administered Medications  Medication Dose Route Frequency Provider Last Rate Last Dose  . acetaminophen (TYLENOL) tablet 650 mg  650 mg Oral Q6H PRN Farris Has  Rojelio Brenner, FNP      . alum & mag hydroxide-simeth (MAALOX/MYLANTA) 200-200-20 MG/5ML suspension 30 mL  30 mL Oral Q4H PRN Nanci Pina, FNP      . aspirin EC tablet 81 mg  81 mg Oral Daily Nanci Pina, FNP   81 mg at 01/10/16 7591  . atorvastatin (LIPITOR) tablet 40 mg  40 mg Oral QPM Nanci Pina, FNP   40 mg at 01/09/16 1706  . carvedilol (COREG) tablet 6.25 mg  6.25 mg Oral BID Nanci Pina, FNP   6.25 mg at 01/10/16 6384  . furosemide (LASIX) tablet 40 mg  40 mg Oral BID Nanci Pina, FNP   40 mg at 01/10/16 6659  . hydrOXYzine (ATARAX/VISTARIL) tablet 25 mg  25 mg Oral TID PRN Nanci Pina, FNP       . lisinopril (PRINIVIL,ZESTRIL) tablet 5 mg  5 mg Oral Daily Nanci Pina, FNP   5 mg at 01/10/16 9357  . magnesium hydroxide (MILK OF MAGNESIA) suspension 30 mL  30 mL Oral Daily PRN Nanci Pina, FNP      . nicotine (NICODERM CQ - dosed in mg/24 hours) patch 21 mg  21 mg Transdermal Daily Fernando A Cobos, MD      . nitroGLYCERIN (NITROSTAT) SL tablet 0.4 mg  0.4 mg Sublingual Q5 min PRN Nanci Pina, FNP      . sertraline (ZOLOFT) tablet 100 mg  100 mg Oral Daily Jenne Campus, MD   100 mg at 01/10/16 0811  . spironolactone (ALDACTONE) tablet 25 mg  25 mg Oral Daily Nanci Pina, FNP   25 mg at 01/10/16 0177  . traZODone (DESYREL) tablet 50 mg  50 mg Oral QHS PRN Jenne Campus, MD        Lab Results:  Results for orders placed or performed during the hospital encounter of 01/06/16 (from the past 48 hour(s))  T4, free     Status: Abnormal   Collection Time: 01/09/16  6:20 AM  Result Value Ref Range   Free T4 1.24 (H) 0.61 - 1.12 ng/dL    Comment: (NOTE) Biotin ingestion may interfere with free T4 tests. If the results are inconsistent with the TSH level, previous test results, or the clinical presentation, then consider biotin interference. If needed, order repeat testing after stopping biotin. Performed at [facility]     Blood Alcohol level:  Lab Results  Component Value Date   [facility] <5 93/90/3009    Metabolic Disorder Labs: No results found for: HGBA1C, MPG No results found for: PROLACTIN Lab Results  Component Value Date   CHOL  02/18/2010    114        ATP III CLASSIFICATION:  <200     mg/dL   Desirable  200-239  mg/dL   Borderline High  >=240    mg/dL   High          TRIG 109 02/18/2010   HDL 43 02/18/2010   CHOLHDL 2.7 02/18/2010   VLDL 22 02/18/2010   LDLCALC  02/18/2010    49        Total Cholesterol/HDL:CHD Risk Coronary Heart Disease Risk Table                     Men   Women  1/2 Average Risk   3.4   3.3  Average Risk        5.0   4.4  2  X Average Risk   9.6   7.1  3 X Average Risk  23.4   11.0        Use the calculated Patient Ratio above and the CHD Risk Table to determine the patient's CHD Risk.        ATP III CLASSIFICATION (LDL):  <100     mg/dL   Optimal  100-129  mg/dL   Near or Above                    Optimal  130-159  mg/dL   Borderline  160-189  mg/dL   High  >190     mg/dL   Very High    Physical Findings: AIMS: Facial and Oral Movements Muscles of Facial Expression: None, normal Lips and Perioral Area: None, normal Jaw: None, normal Tongue: None, normal,Extremity Movements Upper (arms, wrists, hands, fingers): None, normal Lower (legs, knees, ankles, toes): None, normal, Trunk Movements Neck, shoulders, hips: None, normal, Overall Severity Severity of abnormal movements (highest score from questions above): None, normal Incapacitation due to abnormal movements: None, normal Patient's awareness of abnormal movements (rate only patient's report): No Awareness, Dental Status Current problems with teeth and/or dentures?: No Does patient usually wear dentures?: No  CIWA:    COWS:     Musculoskeletal: Strength & Muscle Tone: within normal limits Gait & Station: normal Patient leans: N/A  Psychiatric Specialty Exam: Physical Exam   ROS  no headache, no chest pain, no shortness of breath, no vomiting, no rash   Blood pressure 115/88, pulse 61, temperature 97.9 F (36.6 C), temperature source Oral, resp. rate 16, height 5' 11" (1.803 m), weight 86.2 kg (190 lb).Body mass index is 26.5 kg/m.  General Appearance: Fairly Groomed  Eye Contact:  Good  Speech:  Normal Rate  Volume:  Normal  Mood:  reports feeling better, less depressed   Affect:  Appropriate and Congruent  Thought Process:  Linear  Orientation:  Full (Time, Place, and Person)  Thought Content:  denies hallucinations, no delusions, not internally preoccupied   Suicidal Thoughts:  No- denies any suicidal ideations,  denies any self injurious ideations at this time  Homicidal Thoughts:  No- denies any homicidal ideations   Memory:  recent and remote grossly intact   Judgement:  Other:  improving   Insight:  improving   Psychomotor Activity:  Normal  Concentration:  Concentration: Good and Attention Span: Good  Recall:  Good  Fund of Knowledge:  Good  Language:  Good  Akathisia:  No  Handed:  Right  AIMS (if indicated):     Assets:  Desire for Improvement Resilience  ADL's:  Intact  Cognition:  WNL  Sleep:  Number of Hours: 6   Assessment- at this time patient is presenting with improved mood and range of affect compared to admission, but still presents subdued, sad. Denies suicidal ideations, and today identifies lover for his child as a protective factor against considering suicide in the future . TSH slightly elevated . Thus far tolerating Zoloft well , recently titrated to 100 mgrs daily.  Treatment Plan Summary: Daily contact with patient to assess and evaluate symptoms and progress in treatment, Medication management, Plan inpatient treatment  and medications as below   Continue group, milieu participation to work on coping skills and symptom reduction Continue Zoloft 100 mgrs QDAY for depression, anxiety  Continue Vistaril 25 mgrs Q 8 hours PRN for anxiety  Continue Trazodone 50 mgrs QHS PRN for insomnia Check T3  Pending,, T4 1.24 levels to follow up on elevated TSH  Nanci Pina, FNP 01/10/2016, 12:36 PM  Patient seen face-to-face for psychiatric evaluation, chart reviewed and case discussed with the physician extender and developed treatment plan. Reviewed the information documented and agree with the treatment plan. Corena Pilgrim, MD

## 2016-01-11 LAB — T3, FREE: T3, Free: 3.9 pg/mL (ref 2.0–4.4)

## 2016-01-11 MED ORDER — NITROGLYCERIN 0.4 MG SL SUBL: 0.4000 mg | SUBLINGUAL_TABLET | SUBLINGUAL | 12 refills | Status: DC | PRN

## 2016-01-11 MED ORDER — HYDROXYZINE HCL 25 MG PO TABS: 25.0000 mg | ORAL_TABLET | Freq: Three times a day (TID) | ORAL | 0 refills | Status: DC | PRN

## 2016-01-11 MED ORDER — FUROSEMIDE 40 MG PO TABS: 40.0000 mg | ORAL_TABLET | Freq: Two times a day (BID) | ORAL | Status: DC

## 2016-01-11 MED ORDER — ASPIRIN EC 81 MG PO TBEC: 81.0000 mg | DELAYED_RELEASE_TABLET | Freq: Every day | ORAL | Status: DC

## 2016-01-11 MED ORDER — ATORVASTATIN CALCIUM 40 MG PO TABS: 40.0000 mg | ORAL_TABLET | Freq: Every evening | ORAL | Status: DC

## 2016-01-11 MED ORDER — SERTRALINE HCL 100 MG PO TABS: 100.0000 mg | ORAL_TABLET | Freq: Every day | ORAL | 0 refills | Status: DC

## 2016-01-11 MED ORDER — LISINOPRIL 5 MG PO TABS: 5.0000 mg | ORAL_TABLET | Freq: Every day | ORAL | Status: DC

## 2016-01-11 MED ORDER — CARVEDILOL 6.25 MG PO TABS: 6.2500 mg | ORAL_TABLET | Freq: Two times a day (BID) | ORAL | Status: DC

## 2016-01-11 MED ORDER — SPIRONOLACTONE 25 MG PO TABS: 25.0000 mg | ORAL_TABLET | Freq: Every day | ORAL | Status: DC

## 2016-01-11 MED ORDER — NICOTINE 21 MG/24HR TD PT24: 21.0000 mg | MEDICATED_PATCH | Freq: Every day | TRANSDERMAL | 0 refills | Status: DC

## 2016-01-11 MED ORDER — TRAZODONE HCL 50 MG PO TABS: 50.0000 mg | ORAL_TABLET | Freq: Every evening | ORAL | 0 refills | Status: DC | PRN

## 2016-01-11 NOTE — Plan of Care (Signed)
Problem: Education: Goal: Utilization of techniques to improve thought processes will improve Outcome: Progressing Nurse discussed depression/coping skills with patient.    

## 2016-01-11 NOTE — Progress Notes (Signed)
Recreation Therapy Notes  Date: 01/11/16 Time: 0930 Location: 300 Hall Group Room  Group Topic: Stress Management  Goal Area(s) Addresses:  Patient will verbalize importance of using healthy stress management.  Patient will identify positive emotions associated with healthy stress management.    Intervention: Stress Management  Activity :  Healthy Boundaries Guided Imagery.  LRT introduced the technique of guided imagery to the patients.  Pt were to follow along as the LRT read the script to engaged in the activity.    Education:  Stress Management, Discharge Planning.    Clinical Observations/Feedback: Pt did not attend group.   Timika Muench, LRT/CTRS  

## 2016-01-11 NOTE — B[facility] Suicide Risk Assessment (Addendum)
[facility] Discharge Suicide Risk Assessment   Principal Problem: depression  Discharge Diagnoses:  Patient Active Problem List   Diagnosis Date Noted  . MDD (major depressive disorder), severe ([facility]) [F32.2] 01/06/2016    Total Time spent with patient: 30 minutes  Musculoskeletal: Strength & Muscle Tone: within normal limits Gait & Station: normal Patient leans: N/A  Psychiatric Specialty Exam: ROS denies headache, denies chest pain, no shortness of breath at room air, no bleeding   Blood pressure 111/68, pulse 64, temperature 98.3 F (36.8 C), temperature source Oral, resp. rate 16, height 5\' 11"  (1.803 m), weight 190 lb (86.2 kg).Body mass index is 26.5 kg/m.  General Appearance: Well Groomed  Eye Contact::  Good  Speech:  Normal Rate409  Volume:  Normal  Mood:  reports improvement , less depressed, feeling " better"  Affect:  Appropriate and more reactive  Thought Process:  Linear  Orientation:  Full (Time, Place, and Person)  Thought Content:  denies hallucinations, no delusions , not internally preoccupied   Suicidal Thoughts:  No- denies suicidal or self injurious ideations, denies any homicidal ideations , specifically  also denies any violent or homicidal ideations towards his ex SO  Homicidal Thoughts:  No  Memory:  recent and remote grossly intact   Judgement:  Other:  improved   Insight:  improving   Psychomotor Activity:  Normal  Concentration:  Good  Recall:  Good  Fund of Knowledge:Good  Language: Good  Akathisia:  Negative  Handed:  Right  AIMS (if indicated):     Assets:  Desire for Improvement Resilience  Sleep:  Number of Hours: 5  Cognition: WNL  ADL's:  Intact   Mental Status Per Nursing Assessment::   On Admission:  NA  Demographic Factors:  35 year old male   Loss Factors: Separation, GF and their son moved to [place]  Historical Factors: History of depression, no prior suicidal attempts, no history of prior admissions   Risk Reduction  Factors:   Responsible for children under 20 years of age, Sense of responsibility to family and Positive coping skills or problem solving skills  Continued Clinical Symptoms:  At this time patient reports feeling better, and presents with improved mood and  range of affect , no thought disorder , denies suicidal ideations, denies any homicidal or violent ideations, no psychotic symptoms,  Future oriented   Cognitive Features That Contribute To Risk:  No gross cognitive deficits noted upon discharge. Is alert , attentive, and oriented x 3   Suicide Risk:  Mild:  Suicidal ideation of limited frequency, intensity, duration, and specificity.  There are no identifiable plans, no associated intent, mild dysphoria and related symptoms, good self-control (both objective and subjective assessment), few other risk factors, and identifiable protective factors, including available and accessible social support.  Follow-up Information    [facility] Follow up on 02/02/2016.   Why:  Medication management appt with provider on Tuesday Aug. 22nd at 3pm. Please call office if you need to reschedule.  Contact information: [address] [place], [place] [address] [phone] fax [phone]       [facility] .   Why:  Referral for therapy services make on 01/11/16. Staff will contact you within 2-3 business days with appointment information.  Contact information: [address] [place], [place] [address] [phone]          Plan Of Care/Follow-up recommendations:  Activity:  as tolerated Diet:  Heart Healthy  Tests:  NA Other:  See below  Patient is requesting discharge and there are no current grounds for involuntary commitment  Patient is leaving unit in good spirits Plans to follow up as above  Plans to return home  Plans to follow up with PCP, Cardiologist for history of Cardiovascular Disease    Nehemiah Massed, MD 01/11/2016,  3:15 PM

## 2016-01-11 NOTE — Tx Team (Signed)
Interdisciplinary Treatment Plan Update (Adult) Date: 01/11/2016    Time Reviewed: 9:30 AM  Progress in Treatment: Attending groups: Yes Participating in groups: Minimally Taking medication as prescribed: Yes Tolerating medication: Yes Family/Significant other contact made: No, patient has declined collateral contact Patient understands diagnosis: Yes Discussing patient identified problems/goals with staff: Yes Medical problems stabilized or resolved: Yes Denies suicidal/homicidal ideation: Yes Issues/concerns per patient self-inventory: Yes Other:  New problem(s) identified: N/A  Discharge Plan or Barriers: Home with outpatient services.   Reason for Continuation of Hospitalization:  Depression Anxiety Medication Stabilization   Comments: N/A  Estimated length of stay: Discharge anticipated for today 01/11/16    Patient is a 35 year old male with a diagnosis of Major Depressive Disorder who was admitted following a suicide attempt by hanging himself. Pt reports primary triggers for admission include medical issues and relationship stressors. Patient will benefit from crisis stabilization, medication evaluation, group therapy and psycho education in addition to case management for discharge planning. At discharge, it is recommended that Pt remain compliant with established discharge plan and continued treatment.   Review of initial/current patient goals per problem list:  1. Goal(s): Patient will participate in aftercare plan   Met: Yes   Target date: 3-5 days post admission date   As evidenced by: Patient will participate within aftercare plan AEB aftercare provider and housing plan at discharge being identified.  7/27: Goal met. Patient plans to return home to follow up with outpatient services.    2. Goal (s): Patient will exhibit decreased depressive symptoms and suicidal ideations.   Met: Adequate for discharge per MD   Target date: 3-5 days post  admission date   As evidenced by: Patient will utilize self rating of depression at 3 or below and demonstrate decreased signs of depression or be deemed stable for discharge by MD.  7/27: Patient admitted with high depression levels and SI attempt.   7/31: Adequate for discharge per MD. Patient reports improvement in his symptoms, denies SI. He reports feeling safe for discharge at this time.     Attendees: Patient:    Family:    Physician: Dr. Parke Poisson; Dr. Shea Evans; Dr. Modesta Messing 01/11/2016 9:30 AM  Nursing: Grayland Ormond, Larrie Kass., RN 01/11/2016 9:30 AM  Clinical Social Worker: Erasmo Downer Ayana Imhof, LCSW 01/11/2016 9:30 AM  Other: Maxie Better, LCSW  01/11/2016 9:30 AM  Other: Norberto Sorenson, P4CC 01/11/2016 9:30 AM  Other:  01/11/2016 9:30 AM  Other: Agustina Caroli, May Augustin, NP 01/11/2016 9:30 AM  Other:      Scribe for Treatment Team:  Tilden Fossa, Vincent

## 2016-01-11 NOTE — Progress Notes (Signed)
D: Pt denies SI/HI/AVH. Pt is pleasant and cooperative. Pt plans to go to follow-up and try not to isolate .  A: Pt was offered support and encouragement. Pt was given scheduled medications. Pt was encourage to attend groups. Q 15 minute checks were done for safety.  R:Pt attends groups and interacts well with peers and staff. Pt is taking medication. Pt has no complaints.Pt receptive to treatment and safety maintained on unit.

## 2016-01-11 NOTE — Progress Notes (Signed)
  [facility] Adult Case Management Discharge Plan :  Will you be returning to the same living situation after discharge:  Yes,  patient plans to return home At discharge, do you have transportation home?: Yes,  bus pass provided Do you have the ability to pay for your medications: Yes,  patient provided with prescriptions at discharge  Release of information consent forms completed and in the chart;  Patient's signature needed at discharge.  Patient to Follow up at: Follow-up Information    [facility] Follow up on 02/02/2016.   Why:  Medication management appt with provider on Tuesday Aug. 22nd at 3pm. Please call office if you need to reschedule.  Contact information: [address] [place], [place] [address] [phone] fax [phone]       [facility] .   Why:  Referral for therapy services make on 01/11/16. Staff will contact you within 2-3 business days with appointment information.  Contact information: [address] [place], [place] [address] [phone]          Next level of care provider has access to [facility] Link:no  Safety Planning and Suicide Prevention discussed: Yes,  with patient  Have you used any form of tobacco in the last 30 days? (Cigarettes, Smokeless Tobacco, Cigars, and/or Pipes): Yes  Has patient been referred to the Quitline?: Patient refused referral  Patient has been referred for addiction treatment: Yes  Bryan Gardner, [name] 01/11/2016, 10:44 AM

## 2016-01-11 NOTE — Discharge Summary (Signed)
Physician Discharge Summary Note  Patient:  Bryan Gardner is an 35 y.o., male MRN:  [id number] DOB:  1980-09-12 Patient phone:  [phone] (home)  Patient address:   [address] [place] [address],  Total Time spent with patient: Greater than 30 minutes  Date of Admission:  01/06/2016 Date of Discharge: 01-11-16  Reason for Admission: Worsening symptoms of depression triggering suicide attempt.  Principal Problem: Major depressive disorder, severe.  Discharge Diagnoses: Patient Active Problem List   Diagnosis Date Noted  . MDD (major depressive disorder), severe (HCC) [F32.2] 01/06/2016   Past Psychiatric History: Major depression.  Past Medical History:  Past Medical History:  Diagnosis Date  . Myocardial infarction [facility])     Past Surgical History:  Procedure Laterality Date  . CARDIAC SURGERY    . PACEMAKER INSERTION     Family History: History reviewed. No pertinent family history.  Family Psychiatric  History: See H&P  Social History:  History  Alcohol Use No     History  Drug Use No    Social History   Social History  . Marital status: Single    Spouse name: N/A  . Number of children: N/A  . Years of education: N/A   Social History Main Topics  . Smoking status: Current Every Day Smoker    Packs/day: 1.00    Types: Cigarettes  . Smokeless tobacco: Never Used  . Alcohol use No  . Drug use: No  . Sexual activity: Not Currently   Other Topics Concern  . None   Social History Narrative  . None   Hospital Course: 35 year old male, who reports worsening depression in the context of relationship stressors, break up. He attempted suicide by hanging self , which he states was impulsive and in the context of relationship issues.  He states a friend walked in and assisted before he lost consciousness . States that his GF ( with whom he has been living for years, and with whom he has an 63 year old son) left to [place] in June . States " initially I  thought it was going to be a brief separation, that I was going to join them there ,  but now she states she needs more time to think about it ". States that she told him she needed more time a few days ago, after which he has been feeling more depressed.   [name] was admitted to th [facility] adult unit with complaints of worsening symptoms of depression triggering suicide attempt by hanging. He stated on admission that a friend came & saved him prior to him becoming unconscious. He cited relationship break-up as the trigger. He was in need of mood stabilization treatments. During the course of his treatment, he was medicated & discharged on, Sertraline 50 mg for depression & Trazodone 50 mg for insomnia. He was enrolled & participated in the group counseling sessions being offered & held on this unit. He was counseled & learned coping skills that should help him cope better & maintain mood stability after discharge. He was resumed on all his pertinent home medications for the other previously existing medical issues that he presented. He tolerated his treatment regimen without any adverse effects reported.   While his treatment was on going, Ryott's improvement was monitored by observation & his daily reports of symptom reduction noted.  His emotional & mental status were monitored by daily self-inventory reports completed by him & the clinical staff. Bryan Gardner was evaluated daily by the  treatment team for mood stability & the need for continued recovery after discharge. His motivation was an integral factor in his recovery & mood stability. He was offered further treatment options upon discharge & will follow up with the outpatient psychiatric services as listed below.     Upon discharge, [name] was both mentally & medically stable for discharge. He is currently denying suicidal, homicidal ideation, auditory, visual/tactile hallucinations, delusional thoughts & or paranoia.  He left [facility] with all personal belongings in  no apparent distress. Transportation per city bus. [facility] assisted with bus pass.      Physical Findings: AIMS: Facial and Oral Movements Muscles of Facial Expression: None, normal Lips and Perioral Area: None, normal Jaw: None, normal Tongue: None, normal,Extremity Movements Upper (arms, wrists, hands, fingers): None, normal Lower (legs, knees, ankles, toes): None, normal, Trunk Movements Neck, shoulders, hips: None, normal, Overall Severity Severity of abnormal movements (highest score from questions above): None, normal Incapacitation due to abnormal movements: None, normal Patient's awareness of abnormal movements (rate only patient's report): No Awareness, Dental Status Current problems with teeth and/or dentures?: No Does patient usually wear dentures?: No  CIWA:  CIWA-Ar Total: 1 COWS:  COWS Total Score: 1  Musculoskeletal: Strength & Muscle Tone: within normal limits Gait & Station: normal Patient leans: N/A  Psychiatric Specialty Exam: Physical Exam  Constitutional: He appears well-developed.  HENT:  Head: Normocephalic.  Eyes: Pupils are equal, round, and reactive to light.  Neck: Normal range of motion.  Cardiovascular: Normal rate.   Respiratory: Effort normal.  GI: Soft.  Genitourinary:  Genitourinary Comments: Denies any issues in this area  Musculoskeletal: Normal range of motion.  Neurological: He is alert.  Skin: Skin is warm and dry.    Review of Systems  Constitutional: Negative.   HENT: Negative.   Eyes: Negative.   Respiratory: Negative.   Cardiovascular: Negative.   Gastrointestinal: Negative.   Genitourinary: Negative.   Musculoskeletal: Negative.   Skin: Negative.   Neurological: Negative.   Endo/Heme/Allergies: Negative.   Psychiatric/Behavioral: Positive for depression (Stable). Negative for hallucinations, memory loss and suicidal ideas. The patient has insomnia (Stable). The patient is not nervous/anxious.     Blood pressure 111/68, pulse  64, temperature 98.3 F (36.8 C), temperature source Oral, resp. rate 16, height 5\' 11"  (1.803 m), weight 86.2 kg (190 lb).Body mass index is 26.5 kg/m.  See Md's SRA   Have you used any form of tobacco in the last 30 days? (Cigarettes, Smokeless Tobacco, Cigars, and/or Pipes): Yes  Has this patient used any form of tobacco in the last 30 days? (Cigarettes, Smokeless Tobacco, Cigars, and/or Pipes): Yes, provided with a nicotine patch prescription.  Blood Alcohol level:  Lab Results  Component Value Date   ETH <5 01/05/2016   Metabolic Disorder Labs:  No results found for: HGBA1C, MPG No results found for: PROLACTIN Lab Results  Component Value Date   CHOL  02/18/2010    114        ATP III CLASSIFICATION:  <200     mg/dL   Desirable  409-811  mg/dL   Borderline High  >=914    mg/dL   High          TRIG 782 02/18/2010   HDL 43 02/18/2010   CHOLHDL 2.7 02/18/2010   VLDL 22 02/18/2010   LDLCALC  02/18/2010    49        Total Cholesterol/HDL:CHD Risk Coronary Heart Disease Risk Table  Men   Women  1/2 Average Risk   3.4   3.3  Average Risk       5.0   4.4  2 X Average Risk   9.6   7.1  3 X Average Risk  23.4   11.0        Use the calculated Patient Ratio above and the CHD Risk Table to determine the patient's CHD Risk.        ATP III CLASSIFICATION (LDL):  <100     mg/dL   Optimal  161-096  mg/dL   Near or Above                    Optimal  130-159  mg/dL   Borderline  045-409  mg/dL   High  >811     mg/dL   Very High   See Psychiatric Specialty Exam and Suicide Risk Assessment completed by Attending Physician prior to discharge.  Discharge destination:  Home  Is patient on multiple antipsychotic therapies at discharge:  No   Has Patient had three or more failed trials of antipsychotic monotherapy by history:  No  Recommended Plan for Multiple Antipsychotic Therapies: NA     Medication List    TAKE these medications     Indication   aspirin EC 81 MG tablet Take 1 tablet (81 mg total) by mouth daily. For heart health What changed:  additional instructions  Indication:  Heart health   atorvastatin 40 MG tablet Commonly known as:  LIPITOR Take 1 tablet (40 mg total) by mouth every evening. For high cholesterol What changed:  additional instructions  Indication:  Inherited Homozygous Hypercholesterolemia, High Amount of Triglycerides in the Blood   carvedilol 6.25 MG tablet Commonly known as:  COREG Take 1 tablet (6.25 mg total) by mouth 2 (two) times daily. For high blood pressure What changed:  additional instructions  Indication:  High Blood Pressure of Unknown Cause   furosemide 40 MG tablet Commonly known as:  LASIX Take 1 tablet (40 mg total) by mouth 2 (two) times daily. For sewllings What changed:  additional instructions  Indication:  High Blood Pressure   hydrOXYzine 25 MG tablet Commonly known as:  ATARAX/VISTARIL Take 1 tablet (25 mg total) by mouth 3 (three) times daily as needed for anxiety (agitation).  Indication:  Anxiety Neurosis   lisinopril 5 MG tablet Commonly known as:  PRINIVIL,ZESTRIL Take 1 tablet (5 mg total) by mouth daily. For high blood pressure What changed:  additional instructions  Indication:  High Blood Pressure   nicotine 21 mg/24hr patch Commonly known as:  NICODERM CQ - dosed in mg/24 hours Place 1 patch (21 mg total) onto the skin daily. For smoking cessation  Indication:  Nicotine Addiction   nitroGLYCERIN 0.4 MG SL tablet Commonly known as:  NITROSTAT Place 1 tablet (0.4 mg total) under the tongue every 5 (five) minutes as needed for chest pain.  Indication:  Acute Angina Pectoris   sertraline 100 MG tablet Commonly known as:  ZOLOFT Take 1 tablet (100 mg total) by mouth daily. For depression What changed:  medication strength  how much to take  additional instructions  Indication:  Major Depressive Disorder   spironolactone 25 MG tablet Commonly  known as:  ALDACTONE Take 1 tablet (25 mg total) by mouth daily. For high blood pressure What changed:  additional instructions  Indication:  High Blood Pressure   traZODone 50 MG tablet Commonly known as:  DESYREL Take 1 tablet (  50 mg total) by mouth at bedtime as needed for sleep.  Indication:  Trouble Sleeping      Follow-up Information    [facility] Follow up on 02/02/2016.   Why:  Medication management appt with provider on Tuesday Aug. 22nd at 3pm. Please call office if you need to reschedule.  Contact information: [address] [place], [place] [address] [phone] fax [phone]       [facility] .   Why:  Referral for therapy services make on 01/11/16. Staff will contact you within 2-3 business days with appointment information.  Contact information: [address] [place], [place] [address] [phone]         Follow-up recommendations: Activity:  As tolerated Diet: As recommended by your primary care doctor. Keep all scheduled follow-up appointments as recommended.  Comments: Patient is instructed prior to discharge to: Take all medications as prescribed by his/her mental healthcare provider. Report any adverse effects and or reactions from the medicines to his/her outpatient provider promptly. Patient has been instructed & cautioned: To not engage in alcohol and or illegal drug use while on prescription medicines. In the event of worsening symptoms, patient is instructed to call the crisis hotline, 911 and or go to the nearest ED for appropriate evaluation and treatment of symptoms. To follow-up with his/her primary care provider for your other medical issues, concerns and or health care needs.   Signed: Sanjuana Kava, NP, PMHNP, FNP-BC 01/11/2016, 3:28 PM   Patient seen, Suicide Assessment Completed.  Disposition Plan Reviewed

## 2016-01-11 NOTE — Progress Notes (Signed)
D:  Patient's self inventory sheet, patient sleeps good, sleep medication is helpful.   Good appetite, normal energy level, good concentration.  Denied depression, hopeless and anxiety.  Denied withdrawals.  Denied SI.  Denied physical problems.  Denied pain.  Goal is happiness and discharge.  Plans to think positive and talk to MD.  Does have discharge plans. A:  Medications administered per MD orders.  Emotional support and encouragement given patient. R:  Denied SI and HI, contracts for safety.  Denied A/V hallucinations.  Safety maintained with 15 minute checks.

## 2016-01-11 NOTE — Progress Notes (Signed)
Discharge Note:  Patient discharged home with friend.  Patient denied SI and HI.  Denied A/V hallucinations.  Suicide prevention information given and discussed with patient who stated he understood and had no questions.  Patient stated he received all his belongings, clothing, shoes, phone, wallet, belt, cards, prescriptions, etc.  Patient stated he appreciated all assistance received from [facility] saff.  All required discharge information given to patient at discharge.

## 2016-01-12 NOTE — Progress Notes (Signed)
[facility] not contracted to provide therapy with [facility] Medicare/Medicaid. CSW recommending that patient follow up with [facility] for therapy services if interested. CSW called patient to update him but no answer, CSW unable to leave voicemail.  Samuella Bruin, LCSW Clinical Social Worker [facility] [phone]

## 2016-01-12 NOTE — Progress Notes (Signed)
  [facility] Adult Case Management Discharge Plan :  Will you be returning to the same living situation after discharge:  Yes,  patient plans to return home At discharge, do you have transportation home?: Yes,  bus pass provided Do you have the ability to pay for your medications: Yes,  patient provided with prescriptions at discharge  Release of information consent forms completed and in the chart;  Patient's signature needed at discharge.  Patient to Follow up at: Follow-up Information    [facility] Follow up on 02/02/2016.   Why:  Medication management appt with provider on Tuesday Aug. 22nd at 3pm. Please call office if you need to reschedule.  Contact information: [address] [place], [place] [address] [phone] fax [phone]          Next level of care provider has access to [facility] Link:no  Safety Planning and Suicide Prevention discussed: Yes,  with patient  Have you used any form of tobacco in the last 30 days? (Cigarettes, Smokeless Tobacco, Cigars, and/or Pipes): Yes  Has patient been referred to the Quitline?: Patient refused referral  Patient has been referred for addiction treatment: Yes  Oniya Mandarino, [name] 01/12/2016, 12:51 PM

## 2016-02-02 DIAGNOSIS — I2510 Atherosclerotic heart disease of native coronary artery without angina pectoris: Secondary | ICD-10-CM | POA: Diagnosis not present

## 2016-02-02 DIAGNOSIS — I5022 Chronic systolic (congestive) heart failure: Secondary | ICD-10-CM | POA: Diagnosis not present

## 2016-02-02 DIAGNOSIS — Z95810 Presence of automatic (implantable) cardiac defibrillator: Secondary | ICD-10-CM | POA: Diagnosis not present

## 2017-05-02 ENCOUNTER — Telehealth (HOSPITAL_COMMUNITY): Payer: Self-pay | Admitting: Vascular Surgery

## 2017-05-02 NOTE — Telephone Encounter (Signed)
Left pt message to [facility]

## 2017-05-10 ENCOUNTER — Other Ambulatory Visit (HOSPITAL_COMMUNITY): Payer: Self-pay | Admitting: *Deleted

## 2017-05-10 MED ORDER — FUROSEMIDE 40 MG PO TABS: 40.0000 mg | ORAL_TABLET | Freq: Every day | ORAL | 0 refills | Status: DC

## 2017-06-08 ENCOUNTER — Ambulatory Visit (HOSPITAL_COMMUNITY)
Admission: RE | Admit: 2017-06-08 | Discharge: 2017-06-08 | Disposition: A | Discharge status: Home / Self Care | Payer: Medicare Other | Source: Ambulatory Visit | Attending: Internal Medicine | Admitting: Internal Medicine

## 2017-06-08 ENCOUNTER — Telehealth (HOSPITAL_COMMUNITY): Payer: Self-pay | Admitting: Surgery

## 2017-06-08 ENCOUNTER — Encounter (HOSPITAL_COMMUNITY): Payer: Self-pay | Admitting: Internal Medicine

## 2017-06-08 ENCOUNTER — Other Ambulatory Visit: Payer: Self-pay

## 2017-06-08 VITALS — BP 118/87 | HR 80 | Wt 212.5 lb

## 2017-06-08 DIAGNOSIS — Z86718 Personal history of other venous thrombosis and embolism: Secondary | ICD-10-CM | POA: Insufficient documentation

## 2017-06-08 DIAGNOSIS — Z95810 Presence of automatic (implantable) cardiac defibrillator: Secondary | ICD-10-CM | POA: Diagnosis not present

## 2017-06-08 DIAGNOSIS — Z9119 Patient's noncompliance with other medical treatment and regimen: Secondary | ICD-10-CM | POA: Diagnosis not present

## 2017-06-08 DIAGNOSIS — I259 Chronic ischemic heart disease, unspecified: Secondary | ICD-10-CM | POA: Insufficient documentation

## 2017-06-08 DIAGNOSIS — Z720 Tobacco use: Secondary | ICD-10-CM

## 2017-06-08 DIAGNOSIS — F17210 Nicotine dependence, cigarettes, uncomplicated: Secondary | ICD-10-CM | POA: Diagnosis not present

## 2017-06-08 DIAGNOSIS — I252 Old myocardial infarction: Secondary | ICD-10-CM | POA: Diagnosis not present

## 2017-06-08 DIAGNOSIS — Z79899 Other long term (current) drug therapy: Secondary | ICD-10-CM | POA: Diagnosis not present

## 2017-06-08 DIAGNOSIS — Z955 Presence of coronary angioplasty implant and graft: Secondary | ICD-10-CM | POA: Diagnosis not present

## 2017-06-08 DIAGNOSIS — Z7982 Long term (current) use of aspirin: Secondary | ICD-10-CM | POA: Insufficient documentation

## 2017-06-08 DIAGNOSIS — I5022 Chronic systolic (congestive) heart failure: Secondary | ICD-10-CM | POA: Diagnosis not present

## 2017-06-08 DIAGNOSIS — F329 Major depressive disorder, single episode, unspecified: Secondary | ICD-10-CM | POA: Insufficient documentation

## 2017-06-08 DIAGNOSIS — I2510 Atherosclerotic heart disease of native coronary artery without angina pectoris: Secondary | ICD-10-CM | POA: Diagnosis not present

## 2017-06-08 LAB — CBC
HEMATOCRIT: 47.8 % (ref 39.0–52.0)
HEMOGLOBIN: 16.6 g/dL (ref 13.0–17.0)
MCH: 28.6 pg (ref 26.0–34.0)
MCHC: 34.7 g/dL (ref 30.0–36.0)
MCV: 82.4 fL (ref 78.0–100.0)
Platelets: 306 10*3/uL (ref 150–400)
RBC: 5.8 MIL/uL (ref 4.22–5.81)
RDW: 13.2 % (ref 11.5–15.5)
WBC: 10.5 10*3/uL (ref 4.0–10.5)

## 2017-06-08 LAB — COMPREHENSIVE METABOLIC PANEL
ALBUMIN: 4.5 g/dL (ref 3.5–5.0)
ALT: 21 U/L (ref 17–63)
ANION GAP: 10 (ref 5–15)
AST: 22 U/L (ref 15–41)
Alkaline Phosphatase: 77 U/L (ref 38–126)
BUN: 13 mg/dL (ref 6–20)
CO2: 21 mmol/L — AB (ref 22–32)
Calcium: 9.4 mg/dL (ref 8.9–10.3)
Chloride: 103 mmol/L (ref 101–111)
Creatinine, Ser: 1.04 mg/dL (ref 0.61–1.24)
GFR calc Af Amer: >60 mL/min (ref >60)
GFR calc non Af Amer: >60 mL/min (ref >60)
Glucose, Bld: 94 mg/dL (ref 65–99)
POTASSIUM: 4.3 mmol/L (ref 3.5–5.1)
SODIUM: 134 mmol/L — AB (ref 135–145)
Total Bilirubin: 0.6 mg/dL (ref 0.3–1.2)
Total Protein: 7.8 g/dL (ref 6.5–8.1)

## 2017-06-08 LAB — BRAIN NATRIURETIC PEPTIDE: B Natriuretic Peptide: 26.9 pg/mL (ref 0.0–100.0)

## 2017-06-08 MED ORDER — FUROSEMIDE 40 MG PO TABS: 40.0000 mg | ORAL_TABLET | Freq: Every day | ORAL | 6 refills | Status: DC

## 2017-06-08 MED ORDER — SACUBITRIL-VALSARTAN 24-26 MG PO TABS: 1.0000 | ORAL_TABLET | Freq: Two times a day (BID) | ORAL | 6 refills | Status: DC

## 2017-06-08 MED ORDER — NITROGLYCERIN 0.4 MG SL SUBL: 0.4000 mg | SUBLINGUAL_TABLET | SUBLINGUAL | 12 refills | Status: AC | PRN

## 2017-06-08 MED ORDER — CARVEDILOL 6.25 MG PO TABS: 6.2500 mg | ORAL_TABLET | Freq: Two times a day (BID) | ORAL | 6 refills | Status: DC

## 2017-06-08 MED ORDER — ROSUVASTATIN CALCIUM 20 MG PO TABS: 20.0000 mg | ORAL_TABLET | Freq: Every day | ORAL | 6 refills | Status: DC

## 2017-06-08 NOTE — Telephone Encounter (Signed)
Patient has been referred to the HF Community Paramedicine Program.  I have sent all appropriate paperwork via secure email to the paramedic team.  

## 2017-06-08 NOTE — Progress Notes (Signed)
ADVANCED HF CLINIC CONSULT NOTE  Referring Physician: S. Waters NP [facility] Primary Care: Primary Cardiologist: Ilda FoilJason Katz MD; Caryl AdaSarah Waters [facility]  HPI:  Mr Bryan DawesRitter is a 36 yo with a history of tobacco use, depression. CAD s/p previous anterior MI (at age 36), chronic systolic HF (EF 25%) s/p STJ ICD who is referred by Caryl AdaSarah Waters [facility] to [facility] care in the HF Clinic in [place].   He experienced anterior MI at age 36 with apparent late presentation. Unnderwent  PCI of LAD but stent later found to be totally occluded with no viability on cMRI.  Cath / PCI: --s/p PCI w/ DES to the LAD 03/[facility]  --totally occluded LAD on cath 11/2009 at [facility]    6/11 cMRI  1) EF 26% 2)Left anterior descending coronary arterial distribution infarct involving the entirety of the anterior cardiac wall, portions of the intraventricular septum and posterior inferior cardiac wall, with circumferential involvement and aneurysmal dilatation of the cardiac apex. 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  His history has been plaqued by noncompliance with f/u. [facility]. Prior to that wasn't seen since 6/16. He has never had his ICD checked since implant. Went to see her to renew meds because he ran out.  Now moved to [place] and transferring care here. Still smoking 1 ppd. No CP. Not working. On disability. Says he struggles with fluid build up. Ran out of all his heart meds for 4-5 months. Called our office and we restarted lasix a few weeks ago. Not taking any other medicines. Has lost about 15-20 pounds. Denies orthopnea, PND or edema. Now that fluid is off can do almost all activities without too much problem if he takes his time. .   Review of Systems: [y] = yes, [ ]  = no   General: Weight gain [ ] ; Weight loss [ ] ; Anorexia [ ] ;  Fatigue Cove.Etienne[y ]; Fever [ ] ; Chills [ ] ; Weakness [ ]   Cardiac: Chest pain/pressure [ ] ; Resting SOB [ ] ; Exertional SOB [ ] ; Orthopnea [ ] ; Pedal Edema [ ] ; Palpitations [ ] ; Syncope [ ] ; Presyncope [ ] ; Paroxysmal nocturnal dyspnea[ ]   Pulmonary: Cough [ ] ; Wheezing[ ] ; Hemoptysis[ ] ; Sputum [ ] ; Snoring [ ]   GI: Vomiting[ ] ; Dysphagia[ ] ; Melena[ ] ; Hematochezia [ ] ; Heartburn[ ] ; Abdominal pain [ ] ; Constipation [ ] ; Diarrhea [ ] ; BRBPR [ ]   GU: Hematuria[ ] ; Dysuria [ ] ; Nocturia[ ]   Vascular: Pain in legs with walking [ ] ; Pain in feet with lying flat [ ] ; Non-healing sores [ ] ; Stroke [ ] ; TIA [ ] ; Slurred speech [ ] ;  Neuro: Headaches[ ] ; Vertigo[ ] ; Seizures[ ] ; Paresthesias[ ] ;Blurred vision [ ] ; Diplopia [ ] ; Vision changes [ ]   Ortho/Skin: Arthritis [ y]; Joint pain Cove.Etienne[y ]; Muscle pain [ ] ; Joint swelling [ ] ; Back Pain [ ] ; Rash [ ]   Psych: Depression[y ]; Anxiety[ ]   Heme: Bleeding problems [ ] ; Clotting disorders [ ] ; Anemia [ ]   Endocrine: Diabetes [ ] ; Thyroid dysfunction[ ]    Past Medical History:  Diagnosis Date  . Myocardial infarction [facility](HCC)     Current Outpatient Medications  Medication Sig Dispense Refill  . aspirin EC 81 MG tablet Take 1 tablet (81 mg total) by mouth daily. For heart health    . carvedilol (COREG) 6.25 MG tablet  Take 1 tablet (6.25 mg total) by mouth 2 (two) times daily. For high blood pressure    . furosemide (LASIX) 40 MG tablet Take 1 tablet (40 mg total) by mouth daily. 30 tablet 0  . hydrOXYzine (ATARAX/VISTARIL) 25 MG tablet Take 1 tablet (25 mg total) by mouth 3 (three) times daily as needed for anxiety (agitation). 60 tablet 0  . lisinopril (PRINIVIL,ZESTRIL) 5 MG tablet Take 1 tablet (5 mg total) by mouth daily. For high blood pressure    . nicotine (NICODERM CQ - DOSED IN MG/24 HOURS) 21 mg/24hr patch Place 1 patch (21 mg total) onto the skin daily. For smoking cessation 28 patch 0  . nitroGLYCERIN (NITROSTAT) 0.4 MG SL tablet Place 1 tablet (0.4  mg total) under the tongue every 5 (five) minutes as needed for chest pain.  12  . sertraline (ZOLOFT) 100 MG tablet Take 1 tablet (100 mg total) by mouth daily. For depression 30 tablet 0  . spironolactone (ALDACTONE) 25 MG tablet Take 1 tablet (25 mg total) by mouth daily. For high blood pressure    . traZODone (DESYREL) 50 MG tablet Take 1 tablet (50 mg total) by mouth at bedtime as needed for sleep. 30 tablet 0   No current facility-administered medications for this encounter.     Allergies  Allergen Reactions  . Bupropion Nausea And Vomiting      Social History   Socioeconomic History  . Marital status: Single    Spouse name: Not on file  . Number of children: Not on file  . Years of education: Not on file  . Highest education level: Not on file  Social Needs  . Financial resource strain: Not on file  . Food insecurity - worry: Not on file  . Food insecurity - inability: Not on file  . Transportation needs - medical: Not on file  . Transportation needs - non-medical: Not on file  Occupational History  . Not on file  Tobacco Use  . Smoking status: Current Every Day Smoker    Packs/day: 1.00    Types: Cigarettes  . Smokeless tobacco: Never Used  Substance and Sexual Activity  . Alcohol use: No  . Drug use: No  . Sexual activity: Not Currently  Other Topics Concern  . Not on file  Social History Narrative  . Not on file     No family history on file.  Vitals:   06/08/17 1046  BP: 118/87  Pulse: 80  SpO2: 99%  Weight: 212 lb 8 oz (96.4 kg)    PHYSICAL EXAM: General:  Well appearing. No respiratory difficulty HEENT: normal Neck: supple. no JVD. Carotids 2+ bilat; no bruits. No lymphadenopathy or thryomegaly appreciated. Cor: PMI laterally displaced. Regular rate & rhythm. No rubs, gallops or murmurs. Lungs: clear Abdomen: soft, nontender, nondistended. No hepatosplenomegaly. No bruits or masses. Good bowel sounds. Extremities: no cyanosis, clubbing, rash,  edema Neuro: alert & oriented x 3, cranial nerves grossly intact. moves all 4 extremities w/o difficulty. Affect flat  ECG:   ASSESSMENT & PLAN:  1. Systolic CHF, chronic due to Ischemic heart disease. S/p STJ ICD - last EF 26% by cMRI in 6/11 with no viability in LAD territory - NYHA II - Volume status ok after restarting lasix  - Will continue lasix 40 daily - Start Entresto 24/26 bid - Start carvedilol 3.125 bid - Will arrange Paramedicine f/u - ICD interrogated personally in clinic. Battery with about 4.5 years left. No recent shocks. No AF. Will  arrange ICD f/u with EP.  - Discussed that VAD or transplant would not be a possibility unless he was more complaint with f/u - Repeat echo  2. CAD (coronary artery disease) - History of anterior STEMI with late presentation  - s/p PCI of LAD with reocclusion of stent - no current angina - start ECASA 81, crestor 20  3. 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 Depression - has h/o suicide attempt after his relationship broke up.  - currently not suicidal. Had been of SSRI   5. Tobacco abuse - Ongoing. Not interested in quitting at this time  Total time spent 55 minutes. Over half that time spent discussing above.   Arvilla Meres, MD  9:32 PM

## 2017-06-08 NOTE — Patient Instructions (Signed)
Please Start the following heart medications:  1). Crestor 20 mg daily 2). Furosemide (Lasix) 40 mg daily 3). Carvedilol 3.125 mg Twice daily  4). Entresto 24/26 mg Twice daily  5). Aspirin 81 mg daily (can get this over the counter)  Labs today  Your physician has requested that you have an echocardiogram. Echocardiography is a painless test that uses sound waves to create images of your heart. It provides your doctor with information about the size and shape of your heart and how well your heart's chambers and valves are working. This procedure takes approximately one hour. There are no restrictions for this procedure.  You have been referred to EP to follow your defibrillator   You have been referred to our Paramedicine Program, a paramedic will contact you to schedule a home visit  Your physician recommends that you schedule a follow-up appointment in: 4 weeks with Fara ChuteErika, Pharm D  Your physician recommends that you schedule a follow-up appointment in: 3 months

## 2017-06-09 ENCOUNTER — Telehealth (HOSPITAL_COMMUNITY): Payer: Self-pay

## 2017-06-09 NOTE — Telephone Encounter (Signed)
I called [name] to schedule an initial appointment. He did not answer the phone so I left a voicemail with my information and requested he call me back.

## 2017-06-16 ENCOUNTER — Telehealth (HOSPITAL_COMMUNITY): Payer: Self-pay

## 2017-06-16 NOTE — Telephone Encounter (Signed)
I called [name] to schedule an appointment. He did not answer so I left a voicemail requesting he call me back.

## 2017-06-21 ENCOUNTER — Other Ambulatory Visit (HOSPITAL_COMMUNITY): Payer: Self-pay

## 2017-06-21 NOTE — Progress Notes (Signed)
Paramedicine Encounter    Patient ID: Bryan Gardner, male    DOB: 1980/08/03, 37 y.o.   MRN: [id number]   Patient Care Team: Patient, No Pcp Per as PCP - General (General Practice)  Patient Active Problem List   Diagnosis Date Noted  . MDD (major depressive disorder), severe (HCC) 01/06/2016    Current Outpatient Medications:  .  aspirin EC 81 MG tablet, Take 1 tablet (81 mg total) by mouth daily. For heart health, Disp: , Rfl:  .  furosemide (LASIX) 40 MG tablet, Take 1 tablet (40 mg total) by mouth daily., Disp: 30 tablet, Rfl: 6 .  hydrOXYzine (ATARAX/VISTARIL) 25 MG tablet, Take 1 tablet (25 mg total) by mouth 3 (three) times daily as needed for anxiety (agitation)., Disp: 60 tablet, Rfl: 0 .  nitroGLYCERIN (NITROSTAT) 0.4 MG SL tablet, Place 1 tablet (0.4 mg total) under the tongue every 5 (five) minutes as needed for chest pain., Disp: 25 tablet, Rfl: 12 .  rosuvastatin (CRESTOR) 20 MG tablet, Take 1 tablet (20 mg total) by mouth daily., Disp: 30 tablet, Rfl: 6 .  sacubitril-valsartan (ENTRESTO) 24-26 MG, Take 1 tablet by mouth 2 (two) times daily., Disp: 60 tablet, Rfl: 6 .  carvedilol (COREG) 6.25 MG tablet, Take 1 tablet (6.25 mg total) by mouth 2 (two) times daily. (Patient not taking: Reported on 06/21/2017), Disp: 60 tablet, Rfl: 6 .  nicotine (NICODERM CQ - DOSED IN MG/24 HOURS) 21 mg/24hr patch, Place 1 patch (21 mg total) onto the skin daily. For smoking cessation (Patient not taking: Reported on 06/21/2017), Disp: 28 patch, Rfl: 0 .  sertraline (ZOLOFT) 100 MG tablet, Take 1 tablet (100 mg total) by mouth daily. For depression (Patient not taking: Reported on 06/21/2017), Disp: 30 tablet, Rfl: 0 .  traZODone (DESYREL) 50 MG tablet, Take 1 tablet (50 mg total) by mouth at bedtime as needed for sleep. (Patient not taking: Reported on 06/21/2017), Disp: 30 tablet, Rfl: 0 Allergies  Allergen Reactions  . Bupropion Nausea And Vomiting      Social History   Socioeconomic History   . Marital status: Single    Spouse name: Not on file  . Number of children: Not on file  . Years of education: Not on file  . Highest education level: Not on file  Social Needs  . Financial resource strain: Not on file  . Food insecurity - worry: Not on file  . Food insecurity - inability: Not on file  . Transportation needs - medical: Not on file  . Transportation needs - non-medical: Not on file  Occupational History  . Not on file  Tobacco Use  . Smoking status: Current Every Day Smoker    Packs/day: 1.00    Types: Cigarettes  . Smokeless tobacco: Never Used  Substance and Sexual Activity  . Alcohol use: No  . Drug use: No  . Sexual activity: Not Currently  Other Topics Concern  . Not on file  Social History Narrative  . Not on file    Physical Exam  Constitutional: He is oriented to person, place, and time.  Cardiovascular: Normal rate and regular rhythm.  Pulmonary/Chest: Effort normal and breath sounds normal. No respiratory distress. He has no wheezes. He has no rales.  Musculoskeletal: Normal range of motion. He exhibits no edema.  Neurological: He is alert and oriented to person, place, and time.  Skin: Skin is warm and dry.  Psychiatric: He has a normal mood and affect.    SAFE -  06/21/17 1100      Situation   Admitting diagnosis  chf    Heart failure history  Exisiting    Comorbidities  HTN;Hx MI/CAD    Readmitted within 30 days  No    Hospital admission within past 12 months  No      Assessment   Lives alone  No    Primary support person  Bryan Gardner    Mode of transportation  personal car;family/friends    Other services involved  None    Home equipement  Scale      Weight   Weighs self daily  Yes    Scale provided  Yes    Records on weight chart  Yes      Resources   Has "Living better w/heart failure" book  No    Has HF Zone tool  No    Able to identify yellow zone signs/when to call MD  No    Records zone daily  No      Medications    Uses a pill box  Yes    Who stocks the pill box  Girlfriend    Pill box checked this visit  Yes    Pill box refilled this visit  No    Difficulty obtaining medications  No    Mail order medications  No    Missed one or more doses of medications per week  Yes    How many missed doses this week  14      Nutrition   Patient receives meals on wheels  No    Patient follows low sodium diet  Yes    Has foods at home that meet the current recommended diet  No    Patient follows low sugar/card diet  No    Nutritional concerns/issues  no      Activity Level   ADL's/Mobility  Independent    How many feet can patient ambulate  500    Typical activity level  Active      Urine   Difficulty urinating  No    Changes in urine  None      Time spent with patient   Time spent with patient   71 Minutes      Community Paramedic Living Environment - 06/21/17 1100      Outside of House   Sidewalk and pathway to house is level and free from any hazards  Yes    Driveway is free from debris/snow/ice  Yes    Outside stairs are stable and have sturdy handrail  Yes    Porch lights are working and provide adequate lighting  Yes      Living Room   Furniture is of adequate height and offers arm rests that assist in getting up and down  Yes    Floor is free from any clutter that would create tripping hazards  Yes    All cords are either behind furniture or secured in a manner that does not cause trip hazards  Yes    All rugs are secured to floor with double-sided tape  No    Lighting is adequate to light room  Yes    All lighting has an easily accessible on/off switch  Yes    Phone is readily accessible near favorite seating areas  Yes    Emergency numbers are printed near all phones in house  No      Kitchen   Items used most often are within easy  reach on low shelves  Yes    Step stool is present, is sturdy and has a handrail  N/A    Floor mats are non-slip tread and secured to floor  N/A    Oven  controls are within easy reach  Yes    Kitchen lighting is adequate and easy to reach switches  Yes    ABC fire extinguisher is located in kitchen  No      Stairs   Carpet is properly secured to stairs and/or all wood is properly secured  N/A    Handrail is present and sturdy  N/A    Stairs are free from any clutter  N/A    Stairway is adequately lit  N/A      Bathroom   Tub and shower have a non-slip surface  Yes    Tub and/or shower have a grab bar for stability  Yes    Toilet has a raised seat  No    Grab bar is attached near toilet for assistance  No    Pathway from bedroom to bathroom is free from clutter and well lit for ease of movement in the middle of the night  Yes      Bedroom   Floor is free from clutter  Yes    Light is near bed and is easy to turn on  Yes    Phone is next to bed and within easy reach  Yes    Flashlight is near bed in case of emergency  Yes      General   Smoke detectors in all areas of the house (each floor) and tested  Yes    CO detectors on each floor of house and tested  N/A    Flashlights are handy throughout the home  Yes    Resident has all medical information readily available and in an area emergency providers will easily find  Yes    All heaters are away from any type of flammable material  N/A      Overall Tips   Homeowner ha good non-skid shoes to move around house  Yes    All assisted walking devices are readily accessible and in good condition  N/A    There is a phone near the floor for ease of reach in case of a fall  YES    All O2 tubing is less than 50 ft. and is not a trip hazard  N/A    Resident has had an annual hearing and vision check by a physician  No    Resident has the proper hearing and visual aids prescribed and are in good working order  N/A    All medications are properly stored and labeled to avoid confusion on dosage, time to take, and avoidance of missed doses  Yes        Future Appointments  Date Time Provider  Department Center  07/04/2017  2:30 PM Marinus Maw, MD [facility]  07/05/2017  1:15 PM [facility] PHARMACY [facility] None  07/05/2017  2:00 PM [facility] E[facility]  09/06/2017  2:30 PM [facility] PA/NP [facility] None    BP 130/90 (BP Location: Left Arm, Patient Position: Sitting, Cuff Size: Normal)   Pulse 84   Resp 16   Wt 207 lb (93.9 kg)   SpO2 97%   BMI 28.87 kg/m   Weight yesterday- N/a Last visit weight- N/a   Bryan Gardner was seen at his mother's house for the first  time today. He stated he is feeling well and denied SOB, headache, dizziness and orthopnea. He just recently got back on all of his medications with the exception of carvedilol which he said was not filled by the pharmacy. I called [facility] who advised that the medication was ready for pick up and had been for the last 8 days. Bryan Gardner said he would go pick up the medication today. His girlfriend has been filling his pillbox which appeared to be filled appropriately. He said he would prefer to let her continue for now. He is currently without a PCP but is also in the process of moving. I will look into providers near his new address when the move is complete. He is not taking trazodone at present because he does not have a PCP to prescribe it but we will make it a priority when he gets established. He has not been weighing himself daily because he did not have a scale, however I provided him one today. I will also provide him with a "Living Better with Heart Failure" book next week.   Time spent with patient: 39 minutes   Bryan Gardner, EMT 06/21/17  ACTION: Home visit completed Next visit planned for 1 week

## 2017-06-29 ENCOUNTER — Telehealth (HOSPITAL_COMMUNITY): Payer: Self-pay

## 2017-06-29 NOTE — Telephone Encounter (Signed)
I called [name] to schedule an appointment for either today or tomorrow. A woman answered and said he was not around and took a message for him to call me back.

## 2017-07-04 ENCOUNTER — Encounter: Payer: Self-pay | Admitting: Internal Medicine

## 2017-07-04 ENCOUNTER — Other Ambulatory Visit: Payer: Self-pay | Admitting: Internal Medicine

## 2017-07-05 ENCOUNTER — Ambulatory Visit (HOSPITAL_BASED_OUTPATIENT_CLINIC_OR_DEPARTMENT_OTHER)
Admission: RE | Admit: 2017-07-05 | Discharge: 2017-07-05 | Disposition: A | Discharge status: Home / Self Care | Payer: Medicare Other | Source: Ambulatory Visit

## 2017-07-05 ENCOUNTER — Other Ambulatory Visit (HOSPITAL_COMMUNITY): Payer: Self-pay

## 2017-07-05 ENCOUNTER — Ambulatory Visit (HOSPITAL_COMMUNITY)
Admission: RE | Admit: 2017-07-05 | Discharge: 2017-07-05 | Disposition: A | Discharge status: Home / Self Care | Payer: Medicare Other | Source: Ambulatory Visit | Attending: Internal Medicine | Admitting: Internal Medicine

## 2017-07-05 VITALS — BP 136/86 | HR 64 | Wt 213.2 lb

## 2017-07-05 DIAGNOSIS — I5022 Chronic systolic (congestive) heart failure: Secondary | ICD-10-CM | POA: Diagnosis not present

## 2017-07-05 LAB — BASIC METABOLIC PANEL
Anion gap: 11 (ref 5–15)
BUN: 9 mg/dL (ref 6–20)
CALCIUM: 9.3 mg/dL (ref 8.9–10.3)
CO2: 23 mmol/L (ref 22–32)
Chloride: 102 mmol/L (ref 101–111)
Creatinine, Ser: 1.2 mg/dL (ref 0.61–1.24)
GFR calc Af Amer: >60 mL/min (ref >60)
GLUCOSE: 104 mg/dL — AB (ref 65–99)
Potassium: 4.2 mmol/L (ref 3.5–5.1)
SODIUM: 136 mmol/L (ref 135–145)

## 2017-07-05 LAB — ECHOCARDIOGRAM COMPLETE
E decel time: 225 msec
EERAT: 11.56
FS: 18 % — AB (ref 28–44)
IV/PV OW: 0.83
LA ID, A-P, ES: 40 mm
LA diam end sys: 40 mm
LA vol A4C: 45.2 ml
LA vol: 50.7 mL
LADIAMINDEX: 1.8 cm/m2
LAVOLIN: 22.8 mL/m2
LV PW d: 12 mm — AB (ref 0.6–1.1)
LV TDI E'LATERAL: 5.33
LV TDI E'MEDIAL: 6.09
LV e' LATERAL: 5.33 cm/s
LVEEAVG: 11.56
LVEEMED: 11.56
LVOT VTI: 20.7 cm
LVOT area: 3.46 cm2
LVOTD: 21 mm
LVOTPV: 99.8 cm/s
LVOTSV: 72 mL
MV Dec: 225
MV pk A vel: 59.7 m/s
MVPKEVEL: 61.6 m/s
RV LATERAL S' VELOCITY: 10.2 cm/s
RV TAPSE: 19.8 mm
Weight: 3411.2 oz

## 2017-07-05 MED ORDER — SACUBITRIL-VALSARTAN 49-51 MG PO TABS: 1.0000 | ORAL_TABLET | Freq: Two times a day (BID) | ORAL | 5 refills | Status: DC

## 2017-07-05 MED ORDER — FUROSEMIDE 40 MG PO TABS: 40.0000 mg | ORAL_TABLET | Freq: Every day | ORAL | 5 refills | Status: DC

## 2017-07-05 MED ORDER — VARENICLINE TARTRATE 0.5 MG X 11 & 1 MG X 42 PO MISC: ORAL | 0 refills | Status: DC

## 2017-07-05 NOTE — Progress Notes (Signed)
Paramedicine Encounter   Patient ID: Bryan Gardner , male,   DOB: 05/04/1981,36 y.o.,  MRN: [id number]   [facility] She is adding Chantix to help with smoking cessation. He reported being compliant with his medications and they were verified by Cicero DuckErika.   Time spent with patient: 18 minutes  Jacqualine CodeZackery R Brennan Karam, EMT 07/05/2017   ACTION: Next visit planned for 1 week

## 2017-07-05 NOTE — Progress Notes (Signed)
HF MD: BENSIMHON  HPI:  Bryan Gardner is a [age] yo with a history of tobacco use, depression. CAD s/p previous anterior MI (at age 37), chronic systolic HF (EF 25%) s/p STJ ICD who is [facility] by [facility] in [place].   He experienced anterior MI at age 37 with apparent late presentation. Underwent  PCI of LAD but stent later found to be totally occluded with no viability on cMRI.  Here today with Bryan Gardner (paramedic) for pharmacist-led HF medication titration. At last HF clinic visit on 06/08/17, he was started on Crestor 20 mg daily, furosemide 40 mg daily, carvedilol 3.125 mg BID, Entresto 24-26 mg BID and aspirin 81 mg daily. He is now being seen by paramedicine program. He has felt well since last visit but did note PND yesterday evening. Still smoking 1 ppd. Does not have a great appetite and when he does eat he admits to eating higher sodium foods d/t cost of non-sodium containing foods. He is trying to wash canned vegetables. No CP. Not working. On disability.    . Shortness of breath/dyspnea on exertion? Yes - stable . Orthopnea/PND? Yes - over night last night, 1st episode  . Edema? No . Lightheadedness/dizziness? No . Daily weights at home? Yes - 204-212 lb  . Blood pressure/heart rate monitoring at home? No . Following low-sodium/fluid-restricted diet? No - drinks Dr. Reino KentPepper and does not follow a low sodium diet   HF Medications: Carvedilol 6.25 mg PO BID Furosemide 40 mg PO daily - additional 40 mg tablet 2x this past month (takes when feels "pressure in his neck") Entresto 24-26 mg PO BID  Has the patient been experiencing any side effects to the medications prescribed?  no  Does the patient have any problems obtaining medications due to transportation or finances?   No - Medicare and Medicaid  Understanding of regimen: fair Understanding of indications: fair Potential of compliance: fair Patient understands to avoid  NSAIDs. Patient understands to avoid decongestants.    Pertinent Lab Values: . 07/05/17: Serum creatinine 1.20 (BL ~1-1.1), BUN 9, Potassium 4.2, Sodium 136  Vital Signs: . Weight: 213 lb (dry weight: 205-210 lb) . Blood pressure: 136/86 mmHg  . Heart rate: 64 bpm    [name] - 07/05/17 1500      [name]   Bryan   No    Estimated volume prior to reading  High    Fitting Posture  Standing    Height Marker  Tall    Ruler Value  9    Center Strip  Shifted    ReDS Value  42       Assessment: 1. Chronicsystolic CHF (EF 40%26% by cMRI), due to ICM. NYHA class IIsymptoms. - Volume status elevated based on symptoms, weight gain and vest reading at 42% - Will increase lasix to 40 BID x 3 days then continue 40 mg daily with additional PRN weight gain/SOB - Increase Entresto to 49-51 mg BID  - Continue carvedilol 6.25 mg BID - Could consider addition of spironolactone at future visit  - Basic disease state pathophysiology, medication indication, mechanism and side effects reviewed at length with patient and he verbalized understanding 2. CAD (coronary artery disease) - History of anterior STEMI with late presentation (s/p PCI of LAD with reocclusion of stent) - no current angina - continue ASA 81, crestor 20 3. 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 Depression - Has h/o suicide attempt after his relationship broke up  - Currently not suicidal. Has been off SSRI  5. Tobacco abuse - Ongoing but interested in trying Chantix to assist with cessation  Plan: 1) Medication changes: Based on clinical presentation, vital signs and recent labs will increase lasix to 40 bid x 3 days then resume 40 daily with additional PRN and increase Entresto to 49-51 mg BID. Also start Chantix for smoking cessation. 2) Labs: BMET today and in 2 weeks 3) Follow-up: Pharmacy visit in 2 weeks and PA/NP on 09/06/17    Bryan Gardner, PharmD,  BCPS, CPP Clinical Pharmacist Pager: [phone] Phone: [phone] 07/05/2017 1:21 PM

## 2017-07-05 NOTE — Patient Instructions (Addendum)
It was great to meet you!  Please TAKE furosemide 40 mg (1 tablet) TWICE DAILY X 3 DAYS then resume 40 mg (1 tablet) DAILY.   Please INCREASE your Entresto to 49-51 mg TWICE DAILY. You may take #2 tablets TWICE DAILY of your current Entresto 24-26 mg until you pick up the higher dose.   Please START Chantix as directed on label.   Blood work today. We will call you with any changes.   You have an appointment with the pharmacist, Cicero DuckErika, again on

## 2017-07-05 NOTE — Progress Notes (Signed)
  Echocardiogram 2D Echocardiogram has been performed.  Bryan Gardner, Bryan Gardner 07/05/2017, 4:05 PM

## 2017-07-12 ENCOUNTER — Other Ambulatory Visit (HOSPITAL_COMMUNITY): Payer: Self-pay

## 2017-07-12 ENCOUNTER — Other Ambulatory Visit (HOSPITAL_COMMUNITY): Payer: Self-pay | Admitting: *Deleted

## 2017-07-12 MED ORDER — FUROSEMIDE 40 MG PO TABS: 40.0000 mg | ORAL_TABLET | Freq: Every day | ORAL | 5 refills | Status: DC

## 2017-07-12 MED ORDER — ROSUVASTATIN CALCIUM 20 MG PO TABS: 20.0000 mg | ORAL_TABLET | Freq: Every day | ORAL | 6 refills | Status: DC

## 2017-07-12 NOTE — Telephone Encounter (Signed)
Bryan Gardner with paramedicine called to report patients [name] reading (52). Pt wheezing but states he feels fine.  Per Amy take an extra dose of lasix this evening and have him come in for an office visit at 8am.  Patient ran out of Lasix this morning and does not have money to pick up meds until Friday. I gave Bryan Gardner a [facility] giftcard to pick up Lasix. He was also ran out of Entresto this morning so I gave Bryan Gardner samples of Entresto. Timothy LassoZack will take meds to patient this evening and confirm appointment.

## 2017-07-12 NOTE — Progress Notes (Signed)
Paramedicine Encounter    Patient ID: Bryan FoleyDavid M Gardner, male    DOB: 02-05-1981, 37 y.o.   MRN: [id number]   Patient Care Team: Patient, No Pcp Per as PCP - General (General Practice)  Patient Active Problem List   Diagnosis Date Noted  . MDD (major depressive disorder), severe (HCC) 01/06/2016    Current Outpatient Medications:  .  aspirin EC 81 MG tablet, Take 1 tablet (81 mg total) by mouth daily. For heart health, Disp: , Rfl:  .  carvedilol (COREG) 6.25 MG tablet, Take 1 tablet (6.25 mg total) by mouth 2 (two) times daily., Disp: 60 tablet, Rfl: 6 .  furosemide (LASIX) 40 MG tablet, Take 1 tablet (40 mg total) by mouth daily. Take extra 1 tablet (40 mg) as needed for weight gain or shortness of breath., Disp: 40 tablet, Rfl: 5 .  nicotine (NICODERM CQ - DOSED IN MG/24 HOURS) 21 mg/24hr patch, Place 1 patch (21 mg total) onto the skin daily. For smoking cessation (Patient not taking: Reported on 06/21/2017), Disp: 28 patch, Rfl: 0 .  nitroGLYCERIN (NITROSTAT) 0.4 MG SL tablet, Place 1 tablet (0.4 mg total) under the tongue every 5 (five) minutes as needed for chest pain. (Patient not taking: Reported on 07/05/2017), Disp: 25 tablet, Rfl: 12 .  rosuvastatin (CRESTOR) 20 MG tablet, Take 1 tablet (20 mg total) by mouth daily., Disp: 30 tablet, Rfl: 6 .  sacubitril-valsartan (ENTRESTO) 49-51 MG, Take 1 tablet by mouth 2 (two) times daily., Disp: 60 tablet, Rfl: 5 .  varenicline (CHANTIX STARTING MONTH PAK) 0.5 MG X 11 & 1 MG X 42 tablet, Please take as directed on label., Disp: 53 tablet, Rfl: 0 Allergies  Allergen Reactions  . Bupropion Nausea And Vomiting      Social History   Socioeconomic History  . Marital status: Single    Spouse name: Not on file  . Number of children: Not on file  . Years of education: Not on file  . Highest education level: Not on file  Social Needs  . Financial resource strain: Not on file  . Food insecurity - worry: Not on file  . Food insecurity -  inability: Not on file  . Transportation needs - medical: Not on file  . Transportation needs - non-medical: Not on file  Occupational History  . Not on file  Tobacco Use  . Smoking status: Current Every Day Smoker    Packs/day: 1.00    Types: Cigarettes  . Smokeless tobacco: Never Used  Substance and Sexual Activity  . Alcohol use: No  . Drug use: No  . Sexual activity: Not Currently  Other Topics Concern  . Not on file  Social History Narrative  . Not on file    Physical Exam  Constitutional: He is oriented to person, place, and time.  Cardiovascular: Normal rate and regular rhythm.  Pulmonary/Chest: He has wheezes.  Musculoskeletal: Normal range of motion. He exhibits no edema.  Neurological: He is alert and oriented to person, place, and time.  Skin: Skin is warm and dry.  Psychiatric: He has a normal mood and affect.    SAFE - 06/21/17 1100      Situation   Admitting diagnosis  chf    Heart failure history  Exisiting    Comorbidities  HTN;Hx MI/CAD    Readmitted within 30 days  No    Hospital admission within past 12 months  No      Assessment   Lives alone  No  Primary support person  Bryan Gardner    Mode of transportation  personal car;family/friends    Other services involved  None    Home equipement  Scale      Weight   Weighs self daily  Yes    Scale provided  Yes    Records on weight chart  Yes      Resources   Has "Living better w/heart failure" book  No    Has HF Zone tool  No    Able to identify yellow zone signs/when to call MD  No    Records zone daily  No      Medications   Uses a pill box  Yes    Who stocks the pill box  Bryan Gardner    Pill box checked this visit  Yes    Pill box refilled this visit  No    Difficulty obtaining medications  No    Mail order medications  No    Missed one or more doses of medications per week  Yes    How many missed doses this week  14      Nutrition   Patient receives meals on wheels  No    Patient  follows low sodium diet  Yes    Has foods at home that meet the current recommended diet  No    Patient follows low sugar/card diet  No    Nutritional concerns/issues  no      Activity Level   ADL's/Mobility  Independent    How many feet can patient ambulate  500    Typical activity level  Active      Urine   Difficulty urinating  No    Changes in urine  None      Time spent with patient   Time spent with patient   45 Minutes         Future Appointments  Date Time Provider Department Center  07/17/2017  2:45 PM [facility] PHARMACY [facility] None  08/11/2017  8:45 AM Marinus Maw, MD [facility]  09/06/2017  2:30 PM [facility] PA/NP [facility] None    There were no vitals taken for this visit.  Weight yesterday- 211 lb Last visit weight- 207 lb ReDS: 52%  Bryan Gardner was seen at home today and reported feeling well. He has been moving today and said he was able to do so without significant SOB. Last week he had a vest reading of 42% and was instructed to take an additional 40 mg of lasix over the next few days. He reported doing this however his clip reading was high again today. He is out of lasix, crestor and entresto at present. I contacted the clinic who advised to have him come to the clinic tomorrow morning and also gave samples of entresto to get him through to Friday and a gift card to pick up his lasix. The lasix was picked up and delivered by paramedicine. He advised he would be at the clinic at 08:00 for follow up.   Time spent for patient: 48 minutes  Jacqualine Code, EMT 07/12/17  ACTION: Home visit completed Next visit planned for 1 week

## 2017-07-13 ENCOUNTER — Encounter (HOSPITAL_COMMUNITY): Payer: Self-pay

## 2017-07-13 ENCOUNTER — Ambulatory Visit (HOSPITAL_COMMUNITY)
Admission: RE | Admit: 2017-07-13 | Discharge: 2017-07-13 | Disposition: A | Discharge status: Home / Self Care | Payer: Medicare Other | Source: Ambulatory Visit | Attending: Cardiology | Admitting: Cardiology

## 2017-07-13 VITALS — BP 112/70 | HR 76 | Wt 209.0 lb

## 2017-07-13 DIAGNOSIS — Z9119 Patient's noncompliance with other medical treatment and regimen: Secondary | ICD-10-CM | POA: Diagnosis not present

## 2017-07-13 DIAGNOSIS — Z79899 Other long term (current) drug therapy: Secondary | ICD-10-CM | POA: Insufficient documentation

## 2017-07-13 DIAGNOSIS — I2510 Atherosclerotic heart disease of native coronary artery without angina pectoris: Secondary | ICD-10-CM | POA: Diagnosis not present

## 2017-07-13 DIAGNOSIS — Z95811 Presence of heart assist device: Secondary | ICD-10-CM | POA: Diagnosis not present

## 2017-07-13 DIAGNOSIS — I252 Old myocardial infarction: Secondary | ICD-10-CM | POA: Insufficient documentation

## 2017-07-13 DIAGNOSIS — I5022 Chronic systolic (congestive) heart failure: Secondary | ICD-10-CM | POA: Insufficient documentation

## 2017-07-13 DIAGNOSIS — F329 Major depressive disorder, single episode, unspecified: Secondary | ICD-10-CM | POA: Diagnosis not present

## 2017-07-13 DIAGNOSIS — Z955 Presence of coronary angioplasty implant and graft: Secondary | ICD-10-CM | POA: Diagnosis not present

## 2017-07-13 DIAGNOSIS — I240 Acute coronary thrombosis not resulting in myocardial infarction: Secondary | ICD-10-CM

## 2017-07-13 DIAGNOSIS — Z86718 Personal history of other venous thrombosis and embolism: Secondary | ICD-10-CM | POA: Insufficient documentation

## 2017-07-13 DIAGNOSIS — Z720 Tobacco use: Secondary | ICD-10-CM | POA: Diagnosis not present

## 2017-07-13 DIAGNOSIS — I513 Intracardiac thrombosis, not elsewhere classified: Secondary | ICD-10-CM | POA: Diagnosis not present

## 2017-07-13 DIAGNOSIS — F17210 Nicotine dependence, cigarettes, uncomplicated: Secondary | ICD-10-CM | POA: Diagnosis not present

## 2017-07-13 DIAGNOSIS — Z7982 Long term (current) use of aspirin: Secondary | ICD-10-CM | POA: Diagnosis not present

## 2017-07-13 LAB — BASIC METABOLIC PANEL
Anion gap: 10 (ref 5–15)
BUN: 8 mg/dL (ref 6–20)
CALCIUM: 9.2 mg/dL (ref 8.9–10.3)
CHLORIDE: 103 mmol/L (ref 101–111)
CO2: 22 mmol/L (ref 22–32)
CREATININE: 1.08 mg/dL (ref 0.61–1.24)
GFR calc Af Amer: >60 mL/min (ref >60)
GFR calc non Af Amer: >60 mL/min (ref >60)
Glucose, Bld: 87 mg/dL (ref 65–99)
Potassium: 3.8 mmol/L (ref 3.5–5.1)
SODIUM: 135 mmol/L (ref 135–145)

## 2017-07-13 MED ORDER — FUROSEMIDE 40 MG PO TABS: 40.0000 mg | ORAL_TABLET | Freq: Two times a day (BID) | ORAL | 11 refills | Status: DC

## 2017-07-13 NOTE — Patient Instructions (Signed)
INCREASE Lasix to 80 mg (2 tabs) twice daily TODAY. Then reduce to 40 mg (1 tab) TWICE daily [facility].  Routine lab work today. Will notify you of abnormal results, otherwise no news is good news!  Take all medication as prescribed the day of your appointment. Bring all medications with you to your appointment.  Do the following things EVERYDAY: 1) Weigh yourself in the morning before breakfast. Write it down and keep it in a log. 2) Take your medicines as prescribed 3) Eat low salt foods-Limit salt (sodium) to 2000 mg per day.  4) Stay as active as you can everyday 5) Limit all fluids for the day to less than 2 liters

## 2017-07-13 NOTE — Progress Notes (Signed)
ADVANCED HF CLINIC CONSULT NOTE  Referring Physician: S. Waters NP [facility]) Primary Care: Primary Cardiologist: Ilda Foil MD; Caryl Ada [facility]  HPI: [name] is a 37 yo with a history of tobacco use, depression. CAD s/p previous anterior MI (at age 14), chronic systolic HF (EF 25%) s/p STJ ICD who is referred by Caryl Ada NP-C at [facility] to establish care in the HF Clinic in [place].   He experienced anterior MI at age 67 with apparent late presentation. Unnderwent  PCI of LAD but stent later found to be totally occluded with no viability on cMRI.  Today he returns for follow up. Yesterday he was evaluated by Paramedicine and [name] was 52%. He had been out of entresto, crestor, and lasix for at least 48 hours. He was instructed to take 80 mg lasix twice a day and follow up in the HF clinic/  an additonal 80 mg of lasix. Overall feeling fine. Denies SOB/PND/Orthopnea. Mild dyspnea with steps. No chest pain.  Appetite ok. No fever or chills. Weight at home 207-211 pounds. Eating food from shelters. Smoking 1 PPD. Requires assistance with transportation.   Cath / PCI: --s/p PCI w/ DES to the LAD 03/2009 at [facility]  --totally occluded LAD on cath 11/2009 at [address] [facility]    ECHO 07/05/2017 Left ventricle: Wall thickness was increased in a pattern of   severe LVH. Systolic function was severely reduced. The estimated   ejection fraction was in the range of 10% to 15%. Akinesis of the   apicalanteroseptal, lateral, inferior, and apical myocardium.   Features are consistent with a pseudonormal left ventricular   filling pattern, with concomitant abnormal relaxation and   increased filling pressure (grade 2 diastolic dysfunction). There   was a largethrombus  6/11 cMRI  1) EF 26% 2)Left anterior descending coronary arterial distribution infarct involving the entirety of the anterior cardiac wall, portions of the intraventricular septum and posterior  inferior cardiac wall, with circumferential involvement and aneurysmal dilatation of the cardiac apex. 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   Past Medical History:  Diagnosis Date  . Myocardial infarction [facility])     Current Outpatient Medications  Medication Sig Dispense Refill  . aspirin EC 81 MG tablet Take 1 tablet (81 mg total) by mouth daily. For heart health    . carvedilol (COREG) 6.25 MG tablet Take 1 tablet (6.25 mg total) by mouth 2 (two) times daily. 60 tablet 6  . furosemide (LASIX) 40 MG tablet Take 1 tablet (40 mg total) by mouth daily. Take extra 1 tablet (40 mg) as needed for weight gain or shortness of breath. 40 tablet 5  . nicotine (NICODERM CQ - DOSED IN MG/24 HOURS) 21 mg/24hr patch Place 1 patch (21 mg total) onto the skin daily. For smoking cessation 28 patch 0  . nitroGLYCERIN (NITROSTAT) 0.4 MG SL tablet Place 1 tablet (0.4 mg total) under the tongue every 5 (five) minutes as needed for chest pain. 25 tablet 12  . rosuvastatin (CRESTOR) 20 MG tablet Take 1 tablet (20 mg total) by mouth daily. 30 tablet 6  . sacubitril-valsartan (ENTRESTO) 49-51 MG Take 1 tablet by mouth 2 (two) times daily. 60 tablet 5  . varenicline (CHANTIX STARTING MONTH PAK) 0.5 MG X 11 & 1 MG X 42 tablet Please take as directed on label. 53 tablet 0   No current facility-administered medications for  this encounter.     Allergies  Allergen Reactions  . Bupropion Nausea And Vomiting      Social History   Socioeconomic History  . Marital status: Single    Spouse name: Not on file  . Number of children: Not on file  . Years of education: Not on file  . Highest education level: Not on file  Social Needs  . Financial resource strain: Not on file  . Food insecurity - worry: Not on file  . Food insecurity - inability: Not on file  . Transportation needs - medical: Not  on file  . Transportation needs - non-medical: Not on file  Occupational History  . Not on file  Tobacco Use  . Smoking status: Current Every Day Smoker    Packs/day: 1.00    Types: Cigarettes  . Smokeless tobacco: Never Used  Substance and Sexual Activity  . Alcohol use: No  . Drug use: No  . Sexual activity: Not Currently  Other Topics Concern  . Not on file  Social History Narrative  . Not on file     No family history on file.  Vitals:   07/13/17 0915  BP: 112/70  Pulse: 76  SpO2: 98%  Weight: 209 lb (94.8 kg)   [name] - 07/13/17 0900      [name]   MR   No    Estimated volume prior to reading  Med    Fitting Posture  Sitting    Height Marker  Tall    Ruler Value  9    Center Strip  Shifted    ReDS Value  44      PHYSICAL EXAM: General:  Appears chronically ill. No respiratory difficulty. Mom present.  HEENT: normal Neck: supple. JVD 9-10 . Carotids 2+ bilat; no bruits. No lymphadenopathy or thryomegaly appreciated. Cor: PMI laterally displaced. Regular rate & rhythm. No rubs, gallops or murmurs. Lungs: RLL LLL crackles. On room air.  Abdomen: soft, nontender, nondistended. No hepatosplenomegaly. No bruits or masses. Good bowel sounds. Extremities: no cyanosis, clubbing, rash, edema Neuro: alert & oriented x 3, cranial nerves grossly intact. moves all 4 extremities w/o difficulty. Affect flat     ASSESSMENT & PLAN:  1. Systolic CHF, chronic due to Ischemic heart disease. S/p STJ ICD - last EF 26% by cMRI in 6/11 with no viability in LAD territory - ECHO 07/05/2017 EF 10-15% Grade II DD - - Volume status elevated in the setting of high salt diet and medication noncompliance. [name] trending down from 52>44%.  -Increase lasix 80 mg twice a day today then he will cut lasix back to 40 mg twice a day.  - Continue entresto 49-51 mg twice a day. - Continue  carvedilol 3.125 bid - Will arrange Paramedicine f/u - ICD . Battery with about 4.5 years  left. No recent shocks. No AF. Will arrange ICD f/u with EP.  - Discussed low salt food choices.  -Check BMEt   2. CAD (coronary artery disease) - History of anterior STEMI with late presentation  - s/p PCI of LAD with reocclusion of stent - no s/s ischemia  - start ECASA 81, crestor 20  3. 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 Depression - has h/o suicide attempt after his relationship broke up.  - currently not suicidal. Had been of SSRI   5. Tobacco abuse -discussed smoking cessation     Has  follow up with pharmacy next week. Continue Paramedicine. Needs close follow up. HFSW consulted for next visit for depression. .   Greater than 50% of the (total minutes 25) visit spent in counseling/coordination of care regarding HF medications, medication compliance, and low salt food choices.     Tonye BecketAmy Jolina Symonds, NP  9:20 AM

## 2017-07-17 ENCOUNTER — Ambulatory Visit (HOSPITAL_COMMUNITY): Payer: Self-pay

## 2017-07-20 ENCOUNTER — Telehealth (HOSPITAL_COMMUNITY): Payer: Self-pay

## 2017-07-20 NOTE — Telephone Encounter (Signed)
I called [name] to schedule an appointment. He did not answer so I left a voicemail requesting he call me back so we could meet.

## 2017-07-26 ENCOUNTER — Other Ambulatory Visit (HOSPITAL_COMMUNITY): Payer: Self-pay

## 2017-07-26 NOTE — Progress Notes (Signed)
Paramedicine Encounter    Patient ID: Bryan Gardner, male    DOB: Nov 05, 1980, 37 y.o.   MRN: [id number]   Patient Care Team: Patient, No Pcp Per as PCP - General (General Practice)  Patient Active Problem List   Diagnosis Date Noted  . MDD (major depressive disorder), severe (HCC) 01/06/2016    Current Outpatient Medications:  .  aspirin EC 81 MG tablet, Take 1 tablet (81 mg total) by mouth daily. For heart health, Disp: , Rfl:  .  carvedilol (COREG) 6.25 MG tablet, Take 1 tablet (6.25 mg total) by mouth 2 (two) times daily., Disp: 60 tablet, Rfl: 6 .  furosemide (LASIX) 40 MG tablet, Take 1 tablet (40 mg total) by mouth 2 (two) times daily. Take extra 1 tablet (40 mg) as needed for weight gain or shortness of breath., Disp: 60 tablet, Rfl: 11 .  nitroGLYCERIN (NITROSTAT) 0.4 MG SL tablet, Place 1 tablet (0.4 mg total) under the tongue every 5 (five) minutes as needed for chest pain., Disp: 25 tablet, Rfl: 12 .  rosuvastatin (CRESTOR) 20 MG tablet, Take 1 tablet (20 mg total) by mouth daily., Disp: 30 tablet, Rfl: 6 .  sacubitril-valsartan (ENTRESTO) 49-51 MG, Take 1 tablet by mouth 2 (two) times daily., Disp: 60 tablet, Rfl: 5 .  nicotine (NICODERM CQ - DOSED IN MG/24 HOURS) 21 mg/24hr patch, Place 1 patch (21 mg total) onto the skin daily. For smoking cessation (Patient not taking: Reported on 07/26/2017), Disp: 28 patch, Rfl: 0 .  varenicline (CHANTIX STARTING MONTH PAK) 0.5 MG X 11 & 1 MG X 42 tablet, Please take as directed on label. (Patient not taking: Reported on 07/26/2017), Disp: 53 tablet, Rfl: 0 Allergies  Allergen Reactions  . Bupropion Nausea And Vomiting      Social History   Socioeconomic History  . Marital status: Single    Spouse name: Not on file  . Number of children: Not on file  . Years of education: Not on file  . Highest education level: Not on file  Social Needs  . Financial resource strain: Not on file  . Food insecurity - worry: Not on file  . Food  insecurity - inability: Not on file  . Transportation needs - medical: Not on file  . Transportation needs - non-medical: Not on file  Occupational History  . Not on file  Tobacco Use  . Smoking status: Current Every Day Smoker    Packs/day: 1.00    Types: Cigarettes  . Smokeless tobacco: Never Used  Substance and Sexual Activity  . Alcohol use: No  . Drug use: No  . Sexual activity: Not Currently  Other Topics Concern  . Not on file  Social History Narrative  . Not on file    Physical Exam  Constitutional: He is oriented to person, place, and time.  Cardiovascular: Normal rate and regular rhythm.  Pulmonary/Chest: Effort normal and breath sounds normal. No respiratory distress. He has no wheezes. He has no rales.  Musculoskeletal: Normal range of motion. He exhibits no edema.  Neurological: He is alert and oriented to person, place, and time. He has normal reflexes.  Skin: Skin is warm and dry.  Psychiatric: He has a normal mood and affect.    SAFE - 06/21/17 1100      Situation   Admitting diagnosis  chf    Heart failure history  Exisiting    Comorbidities  HTN;Hx MI/CAD    Readmitted within 30 days  No  Hospital admission within past 12 months  No      Assessment   Lives alone  No    Primary support person  Bryan Gardner    Mode of transportation  personal car;family/friends    Other services involved  None    Home equipement  Scale      Weight   Weighs self daily  Yes    Scale provided  Yes    Records on weight chart  Yes      Resources   Has "Living better w/heart failure" book  No    Has HF Zone tool  No    Able to identify yellow zone signs/when to call MD  No    Records zone daily  No      Medications   Uses a pill box  Yes    Who stocks the pill box  Girlfriend    Pill box checked this visit  Yes    Pill box refilled this visit  No    Difficulty obtaining medications  No    Mail order medications  No    Missed one or more doses of medications  per week  Yes    How many missed doses this week  14      Nutrition   Patient receives meals on wheels  No    Patient follows low sodium diet  Yes    Has foods at home that meet the current recommended diet  No    Patient follows low sugar/card diet  No    Nutritional concerns/issues  no      Activity Level   ADL's/Mobility  Independent    How many feet can patient ambulate  500    Typical activity level  Active      Urine   Difficulty urinating  No    Changes in urine  None      Time spent with patient   Time spent with patient   45 Minutes         Future Appointments  Date Time Provider Department Center  07/27/2017 10:00 AM [facility] PHARMACY [facility] None  08/11/2017  8:45 AM Marinus Mawaylor, Gregg W, MD [facility]  09/06/2017  2:30 PM [facility] PA/NP [facility] None    BP 110/74 (BP Location: Left Arm, Patient Position: Sitting, Cuff Size: Large)   Pulse 66   SpO2 97%   Weight yesterday- 210 lb Last visit weight- 210 lb  [name] was seen at his mother's house today and reported feeling well. He denied SOB, headache, dizziness and orthopnea. He stated he has been taking all his medications with the exception of chantix and the nicotine patches. He said he was not taking those because he thought it was pointless to quit if everyone around him was still smoking. I encouraged him to quit regardless of other people and he was agreeable. His medications were verified and he fills his own pillbox.   Time spent with patient: 30 minutes  Bryan Gardner, EMT 07/26/17  ACTION: Home visit completed Next visit planned for 1 week

## 2017-07-27 ENCOUNTER — Ambulatory Visit (HOSPITAL_COMMUNITY)
Admission: RE | Admit: 2017-07-27 | Discharge: 2017-07-27 | Disposition: A | Discharge status: Home / Self Care | Payer: Medicare Other | Source: Ambulatory Visit | Attending: Internal Medicine | Admitting: Internal Medicine

## 2017-07-27 VITALS — BP 130/82 | HR 62 | Wt 212.0 lb

## 2017-07-27 DIAGNOSIS — F329 Major depressive disorder, single episode, unspecified: Secondary | ICD-10-CM | POA: Insufficient documentation

## 2017-07-27 DIAGNOSIS — Z915 Personal history of self-harm: Secondary | ICD-10-CM | POA: Diagnosis not present

## 2017-07-27 DIAGNOSIS — Z9889 Other specified postprocedural states: Secondary | ICD-10-CM | POA: Diagnosis not present

## 2017-07-27 DIAGNOSIS — Z79899 Other long term (current) drug therapy: Secondary | ICD-10-CM | POA: Insufficient documentation

## 2017-07-27 DIAGNOSIS — Z95810 Presence of automatic (implantable) cardiac defibrillator: Secondary | ICD-10-CM | POA: Insufficient documentation

## 2017-07-27 DIAGNOSIS — F17200 Nicotine dependence, unspecified, uncomplicated: Secondary | ICD-10-CM | POA: Insufficient documentation

## 2017-07-27 DIAGNOSIS — I5022 Chronic systolic (congestive) heart failure: Secondary | ICD-10-CM | POA: Diagnosis not present

## 2017-07-27 DIAGNOSIS — I213 ST elevation (STEMI) myocardial infarction of unspecified site: Secondary | ICD-10-CM | POA: Insufficient documentation

## 2017-07-27 DIAGNOSIS — Z7982 Long term (current) use of aspirin: Secondary | ICD-10-CM | POA: Insufficient documentation

## 2017-07-27 DIAGNOSIS — I2510 Atherosclerotic heart disease of native coronary artery without angina pectoris: Secondary | ICD-10-CM | POA: Diagnosis not present

## 2017-07-27 DIAGNOSIS — Z955 Presence of coronary angioplasty implant and graft: Secondary | ICD-10-CM | POA: Diagnosis not present

## 2017-07-27 DIAGNOSIS — Z86718 Personal history of other venous thrombosis and embolism: Secondary | ICD-10-CM | POA: Insufficient documentation

## 2017-07-27 MED ORDER — SACUBITRIL-VALSARTAN 97-103 MG PO TABS: 1.0000 | ORAL_TABLET | Freq: Two times a day (BID) | ORAL | 5 refills | Status: DC

## 2017-07-27 MED ORDER — ATORVASTATIN CALCIUM 20 MG PO TABS: 20.0000 mg | ORAL_TABLET | Freq: Every day | ORAL | 5 refills | Status: DC

## 2017-07-27 MED ORDER — CARVEDILOL 6.25 MG PO TABS: 6.2500 mg | ORAL_TABLET | Freq: Two times a day (BID) | ORAL | 6 refills | Status: DC

## 2017-07-27 NOTE — Patient Instructions (Addendum)
It was great to see you today!  Please INCREASE your furosemide to 80 mg (2 tablets) in the morning and 40 mg (1 tablet) in the afternoon.   Please INCREASE Entresto to 97-103 mg TWICE DAILY.   You are scheduled for blood work on 08/11/17 at 10:15 PM.   Please keep your appointment with PA/NP on 09/06/17.

## 2017-07-27 NOTE — Progress Notes (Signed)
HF MD: BENSIMHON  HPI:  Bryan Gardner is a 37yo Caucasian male with a history of tobacco use, depression. CAD s/p previous anterior MI (at age 78), chronic systolic HF (EF 25%)s/p STJICD who is referred by Caryl Ada NP-C at [facility] to establish care in the HF Clinic in [place].   He experienced anterior MI at age 87 with apparent late presentation. Underwent PCI of LAD but stent later found to be totally occluded with no viability on cMRI.  Here today with [name] (paramedic) for pharmacist-led HF medication titration. At last HF clinic visit on 07/13/17, his lasix was increased to 40 mg BID. Vest reading at last visit 44%. He is now being seen by paramedicine program. He has felt well since last visit and he feels like he is slightly less SOB than normal. Walking ~20 minutes every other night with his girlfriend and dog. Still smoking 1 ppd. NoCP. Not working. On disability.    Shortness of breath/dyspnea on exertion? Yes - improved  Orthopnea/PND? No  Edema? No  Lightheadedness/dizziness? No  Daily weights at home? Yes - 209-212 lb   Blood pressure/heart rate monitoring at home? No  Following low-sodium/fluid-restricted diet? No - drinks Dr. Reino Kent and does not follow a low sodium diet   HF Medications: Carvedilol 6.25 mg PO BID Furosemide 40 mg PO BID Entresto 49-51 mg PO BID  Has the patient been experiencing any side effects to the medications prescribed?  no  Does the patient have any problems obtaining medications due to transportation or finances?   No - Medicare and Medicaid  Understanding of regimen: fair Understanding of indications: fair Potential of compliance: fair Patient understands to avoid NSAIDs. Patient understands to avoid decongestants.   Pertinent Lab Values:  07/13/17: Serum creatinine 1.08 (BL ~1-1.1), BUN 8, Potassium 3.8, Sodium 135  Vital Signs:  Weight: 212 lb (dry weight: 205-210 lb)  Blood pressure: 130/82 mmHg   Heart rate:  62 bpm     [name] - 07/27/17 1200      [name]   Bryan   No    Estimated volume prior to reading  Med    Fitting Posture  Standing    Height Marker  Tall    Ruler Value  9.5    Center Strip  Shifted    ReDS Value  46       Assessment: 1. Chronicsystolic CHF (EF 16% by cMRI), due to ICM. NYHA class IIsymptoms. - Volume status elevated based on vest reading at 46% and unchanged weight - Increase furosemide to 80 mg qam and 40 mg qpm and Entresto to 97-103 mg BID  - Continue carvedilol 6.25 mg BID as HR at goal   - Basic disease state pathophysiology, medication indication, mechanism and side effects reviewed at length with patient and he verbalized understanding 2. CAD (coronary artery disease) - History ofanteriorSTEMI with late presentation (s/p PCI of LAD with reocclusion of stent) - no current angina - continue ASA 81, atorvastatin 20 3. 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 Depression - Has h/osuicide attempt after his relationship broke up - Currently not suicidal. Has been off SSRI 5. Tobacco abuse - Ongoing but not interested in trying Chantix at this time - Using suckers/candy in place of cigarettes to cut down  Plan: 1) Medication changes: Based on clinical presentation, vital signs and recent labs will increase furosemide to 80 mg QAM and 40 mg QPM  and Entresto to 97-103 mg BID 2) Labs: BMET in 2 weeks  3) Follow-up: PA/NP on 09/06/17    Cicero DuckErika K. Bonnye FavaNicolsen, PharmD, BCPS, CPP Clinical Pharmacist Pager: [phone] Phone: [phone] 07/05/2017 1:21 PM

## 2017-08-03 ENCOUNTER — Telehealth (HOSPITAL_COMMUNITY): Payer: Self-pay

## 2017-08-03 NOTE — Telephone Encounter (Signed)
Mr Lew DawesRitter called me to tell me he was not going to be able to make our appointment today. He left a voicemail and did not answer when I called him back to reschedule.

## 2017-08-10 ENCOUNTER — Telehealth (HOSPITAL_COMMUNITY): Payer: Self-pay

## 2017-08-10 ENCOUNTER — Other Ambulatory Visit (HOSPITAL_COMMUNITY): Payer: Self-pay

## 2017-08-10 NOTE — Telephone Encounter (Signed)
I called Bryan Gardner to schedule an appointment. He did not answer so I left a voicemail requesting he call me back. 

## 2017-08-10 NOTE — Telephone Encounter (Signed)
I called [name] to schedule an appointment. He did not answer so I left a voicemail requesting he call me back.

## 2017-08-10 NOTE — Progress Notes (Signed)
Paramedicine Encounter    Patient ID: Unknown Bryan Gardner, male    DOB: Feb 26, 1981, 37 y.o.   MRN: [id number]   Patient Care Team: Patient, No Pcp Per as PCP - General (General Practice)  Patient Active Problem List   Diagnosis Date Noted  . MDD (major depressive disorder), severe (HCC) 01/06/2016  . Hyperlipidemia with target LDL less than 70 09/19/2012  . LV (left ventricular) mural thrombus 09/19/2012  . Systolic CHF, chronic (HCC) 09/18/2012  . Automatic implantable cardioverter-defibrillator in situ 09/18/2012  . Tobacco use 11/16/2010  . CAD (coronary artery disease), native coronary artery 12/01/2009  . Hypothyroidism 12/01/2009    Current Outpatient Medications:  .  aspirin EC 81 MG tablet, Take 1 tablet (81 mg total) by mouth daily. For heart health, Disp: , Rfl:  .  atorvastatin (LIPITOR) 20 MG tablet, Take 1 tablet (20 mg total) by mouth daily., Disp: 30 tablet, Rfl: 5 .  carvedilol (COREG) 6.25 MG tablet, Take 1 tablet (6.25 mg total) by mouth 2 (two) times daily., Disp: 60 tablet, Rfl: 6 .  furosemide (LASIX) 40 MG tablet, Take 1 tablet (40 mg total) by mouth 2 (two) times daily. Take extra 1 tablet (40 mg) as needed for weight gain or shortness of breath., Disp: 60 tablet, Rfl: 11 .  sacubitril-valsartan (ENTRESTO) 97-103 MG, Take 1 tablet by mouth 2 (two) times daily., Disp: 60 tablet, Rfl: 5 .  nicotine (NICODERM CQ - DOSED IN MG/24 HOURS) 21 mg/24hr patch, Place 1 patch (21 mg total) onto the skin daily. For smoking cessation (Patient not taking: Reported on 08/10/2017), Disp: 28 patch, Rfl: 0 .  nitroGLYCERIN (NITROSTAT) 0.4 MG SL tablet, Place 1 tablet (0.4 mg total) under the tongue every 5 (five) minutes as needed for chest pain. (Patient not taking: Reported on 07/27/2017), Disp: 25 tablet, Rfl: 12 .  varenicline (CHANTIX STARTING MONTH PAK) 0.5 MG X 11 & 1 MG X 42 tablet, Please take as directed on label. (Patient not taking: Reported on 08/10/2017), Disp: 53 tablet, Rfl:  0 Allergies  Allergen Reactions  . Bupropion Nausea And Vomiting      Social History   Socioeconomic History  . Marital status: Single    Spouse name: Not on file  . Number of children: Not on file  . Years of education: Not on file  . Highest education level: Not on file  Social Needs  . Financial resource strain: Not on file  . Food insecurity - worry: Not on file  . Food insecurity - inability: Not on file  . Transportation needs - medical: Not on file  . Transportation needs - non-medical: Not on file  Occupational History  . Not on file  Tobacco Use  . Smoking status: Current Every Day Smoker    Packs/day: 1.00    Types: Cigarettes  . Smokeless tobacco: Never Used  Substance and Sexual Activity  . Alcohol use: No  . Drug use: No  . Sexual activity: Not Currently  Other Topics Concern  . Not on file  Social History Narrative  . Not on file    Physical Exam  Constitutional: He is oriented to person, place, and time.  Neck: No JVD present.  Cardiovascular: Normal rate and regular rhythm.  Pulmonary/Chest: Effort normal. He has wheezes. He has no rales.  Abdominal: Soft.  Musculoskeletal: Normal range of motion. He exhibits no edema.  Neurological: He is alert and oriented to person, place, and time.  Skin: Skin is warm and dry.  Psychiatric: He has a normal mood and affect.    SAFE - 06/21/17 1100      Situation   Admitting diagnosis  chf    Heart failure history  Exisiting    Comorbidities  HTN;Hx MI/CAD    Readmitted within 30 days  No    Hospital admission within past 12 months  No      Assessment   Lives alone  No    Primary support person  Ashok Croon    Mode of transportation  personal car;family/friends    Other services involved  None    Home equipement  Scale      Weight   Weighs self daily  Yes    Scale provided  Yes    Records on weight chart  Yes      Resources   Has "Living better w/heart failure" book  No    Has HF Zone tool  No     Able to identify yellow zone signs/when to call MD  No    Records zone daily  No      Medications   Uses a pill box  Yes    Who stocks the pill box  Girlfriend    Pill box checked this visit  Yes    Pill box refilled this visit  No    Difficulty obtaining medications  No    Mail order medications  No    Missed one or more doses of medications per week  Yes    How many missed doses this week  14      Nutrition   Patient receives meals on wheels  No    Patient follows low sodium diet  Yes    Has foods at home that meet the current recommended diet  No    Patient follows low sugar/card diet  No    Nutritional concerns/issues  no      Activity Level   ADL's/Mobility  Independent    How many feet can patient ambulate  500    Typical activity level  Active      Urine   Difficulty urinating  No    Changes in urine  None      Time spent with patient   Time spent with patient   45 Minutes         Future Appointments  Date Time Provider Department Center  08/11/2017  8:45 AM Marinus Maw, MD [facility]  08/11/2017 10:15 AM [facility] LAB [facility] None  09/06/2017  2:30 PM [facility] PA/NP [facility] None    BP 98/70 (BP Location: Right Arm, Patient Position: Sitting, Cuff Size: Large)   Pulse 70   Resp 16   Wt 209 lb (94.8 kg)   SpO2 96%   BMI 29.15 kg/m   Weight yesterday-208 lb Last visit weight- 210 lb  Mr Schurman was seen at home today and reported feeling well. He denied headache or dizziness. He also denied SOB despite having expiratory wheezing in all lung fields. He did report orthopnea and is having some neck discomfort which has been consistent with his symptoms in the past when he was in fluid overload. He reports no change in urination and being compliant with his medications. He had both atorvastatin and rosuvastatin in his pill bag and he reported taking both. Upon looking into the medications it appeared that atorvastatin was prescribed on 07/27/17  whereas rosuvastatin was prescribed on 07/12/17. I conferred with the clinic staff who advised that  he should be taking the atorvastatin instead of the rosuvastatin. I made this correction in his pillbox and advised him that he did not need to have the rosuvastatin filled anymore. He is still smoking and has not tried to use any nicotine substitutes. No other changes were made at today's visit.  Time spent with patient: 33 minutes  Jacqualine Code, EMT 08/10/17  ACTION: Home visit completed Next visit planned for 1 week

## 2017-08-10 NOTE — Telephone Encounter (Signed)
[name] returned my phone call and advised he was able to meet today at 12:00.

## 2017-08-11 ENCOUNTER — Encounter: Payer: Self-pay | Admitting: Internal Medicine

## 2017-08-11 ENCOUNTER — Ambulatory Visit (INDEPENDENT_AMBULATORY_CARE_PROVIDER_SITE_OTHER): Payer: Medicare Other | Admitting: Internal Medicine

## 2017-08-11 ENCOUNTER — Ambulatory Visit (HOSPITAL_COMMUNITY)
Admission: RE | Admit: 2017-08-11 | Discharge: 2017-08-11 | Disposition: A | Discharge status: Home / Self Care | Payer: Medicare Other | Source: Ambulatory Visit | Attending: Internal Medicine | Admitting: Internal Medicine

## 2017-08-11 VITALS — BP 132/72 | HR 55 | Ht 71.0 in | Wt 209.0 lb

## 2017-08-11 DIAGNOSIS — I2510 Atherosclerotic heart disease of native coronary artery without angina pectoris: Secondary | ICD-10-CM

## 2017-08-11 DIAGNOSIS — I5022 Chronic systolic (congestive) heart failure: Secondary | ICD-10-CM | POA: Insufficient documentation

## 2017-08-11 DIAGNOSIS — Z95810 Presence of automatic (implantable) cardiac defibrillator: Secondary | ICD-10-CM | POA: Diagnosis not present

## 2017-08-11 LAB — CUP PACEART INCLINIC DEVICE CHECK
Brady Statistic RV Percent Paced: 0 %
Date Time Interrogation Session: 20190301130925
HIGH POWER IMPEDANCE MEASURED VALUE: 57.3188
Implantable Lead Implant Date: 20110624
Implantable Lead Location: 753860
Lead Channel Pacing Threshold Amplitude: 1 V
Lead Channel Sensing Intrinsic Amplitude: 11.7 mV
Lead Channel Setting Pacing Amplitude: 2.5 V
Lead Channel Setting Pacing Pulse Width: 0.5 ms
MDC IDC MSMT BATTERY REMAINING LONGEVITY: 50 mo
MDC IDC MSMT LEADCHNL RV IMPEDANCE VALUE: 537.5 Ohm
MDC IDC MSMT LEADCHNL RV PACING THRESHOLD AMPLITUDE: 1 V
MDC IDC MSMT LEADCHNL RV PACING THRESHOLD PULSEWIDTH: 0.5 ms
MDC IDC MSMT LEADCHNL RV PACING THRESHOLD PULSEWIDTH: 0.5 ms
MDC IDC PG IMPLANT DT: 20110624
MDC IDC SET LEADCHNL RV SENSING SENSITIVITY: 0.5 mV
Pulse Gen Serial Number: 615333

## 2017-08-11 LAB — BASIC METABOLIC PANEL
Anion gap: 13 (ref 5–15)
BUN: 9 mg/dL (ref 6–20)
CO2: 23 mmol/L (ref 22–32)
CREATININE: 1.03 mg/dL (ref 0.61–1.24)
Calcium: 9.5 mg/dL (ref 8.9–10.3)
Chloride: 99 mmol/L — ABNORMAL LOW (ref 101–111)
Glucose, Bld: 83 mg/dL (ref 65–99)
Potassium: 3.9 mmol/L (ref 3.5–5.1)
SODIUM: 135 mmol/L (ref 135–145)

## 2017-08-11 NOTE — Progress Notes (Signed)
HPI [name] is referred today by Dr. Dorthea CoveB for ongoing evaluation and management of his ICD. He is a pleasant 37 yo smoker with premature CAD, s/p MI, severe LV dysfunction, s/p prophylactic ICD insertion in 2011. He has not had syncope and denies ICD discharge. He remains active with class 2 CHF symptoms. He denies angina. He has had some problems with non-compliance.  Allergies  Allergen Reactions  . Bupropion Nausea And Vomiting     Current Outpatient Medications  Medication Sig Dispense Refill  . aspirin EC 81 MG tablet Take 1 tablet (81 mg total) by mouth daily. For heart health    . atorvastatin (LIPITOR) 20 MG tablet Take 1 tablet (20 mg total) by mouth daily. 30 tablet 5  . carvedilol (COREG) 6.25 MG tablet Take 1 tablet (6.25 mg total) by mouth 2 (two) times daily. 60 tablet 6  . furosemide (LASIX) 40 MG tablet Take 1 tablet (40 mg total) by mouth 2 (two) times daily. Take extra 1 tablet (40 mg) as needed for weight gain or shortness of breath. 60 tablet 11  . sacubitril-valsartan (ENTRESTO) 97-103 MG Take 1 tablet by mouth 2 (two) times daily. 60 tablet 5  . nicotine (NICODERM CQ - DOSED IN MG/24 HOURS) 21 mg/24hr patch Place 1 patch (21 mg total) onto the skin daily. For smoking cessation (Patient not taking: Reported on 08/10/2017) 28 patch 0  . nitroGLYCERIN (NITROSTAT) 0.4 MG SL tablet Place 1 tablet (0.4 mg total) under the tongue every 5 (five) minutes as needed for chest pain. (Patient not taking: Reported on 07/27/2017) 25 tablet 12  . varenicline (CHANTIX STARTING MONTH PAK) 0.5 MG X 11 & 1 MG X 42 tablet Please take as directed on label. (Patient not taking: Reported on 08/10/2017) 53 tablet 0   No current facility-administered medications for this visit.      Past Medical History:  Diagnosis Date  . Myocardial infarction (HCC)     ROS:   All systems reviewed and negative except as noted in the HPI.   Past Surgical History:  Procedure Laterality Date  .  CARDIAC SURGERY    . PACEMAKER INSERTION       No family history on file.   Social History   Socioeconomic History  . Marital status: Single    Spouse name: Not on file  . Number of children: Not on file  . Years of education: Not on file  . Highest education level: Not on file  Social Needs  . Financial resource strain: Not on file  . Food insecurity - worry: Not on file  . Food insecurity - inability: Not on file  . Transportation needs - medical: Not on file  . Transportation needs - non-medical: Not on file  Occupational History  . Not on file  Tobacco Use  . Smoking status: Current Every Day Smoker    Packs/day: 1.00    Types: Cigarettes  . Smokeless tobacco: Never Used  Substance and Sexual Activity  . Alcohol use: No  . Drug use: No  . Sexual activity: Not Currently  Other Topics Concern  . Not on file  Social History Narrative  . Not on file     BP 132/72   Pulse (!) 55   Ht 5\' 11"  (1.803 m)   Wt 209 lb (94.8 kg)   BMI 29.15 kg/m   Physical Exam:  Well appearing NAD HEENT: Unremarkable Neck:  No JVD, no thyromegally Lymphatics:  No  adenopathy Back:  No CVA tenderness Lungs:  Clear with no wheezes HEART:  Regular rate rhythm, no murmurs, no rubs, no clicks Abd:  soft, positive bowel sounds, no organomegally, no rebound, no guarding Ext:  2 plus pulses, no edema, no cyanosis, no clubbing Skin:  No rashes no nodules Neuro:  CN II through XII intact, motor grossly intact  EKG - none  DEVICE  Normal device function.  See PaceArt for details.   Assess/Plan: 1. ICD - his [employer] single chamber ICD is working normally. 2. Chronic systolic heart failure - he is on optimal medical therapy and his symptoms are class 2. Will follow. 3. Tobacco abuse - he is encouraged to lose weight.   Leonia Reeves.D.

## 2017-08-11 NOTE — Patient Instructions (Signed)

## 2017-08-13 ENCOUNTER — Encounter: Payer: Self-pay | Admitting: Internal Medicine

## 2017-08-17 ENCOUNTER — Telehealth (HOSPITAL_COMMUNITY): Payer: Self-pay

## 2017-08-17 NOTE — Telephone Encounter (Signed)
I called [name] to schedule an appointment. He did not answer so I left a voicemail requesting he call me back to schedule a visit.

## 2017-08-24 ENCOUNTER — Other Ambulatory Visit (HOSPITAL_COMMUNITY): Payer: Self-pay

## 2017-08-24 NOTE — Progress Notes (Signed)
Paramedicine Encounter    Patient ID: Bryan FoleyDavid M Gardner, male    DOB: 02/03/1981, 37 y.o.   MRN: [id number]   Patient Care Team: Patient, No Pcp Per as PCP - General (General Practice)  Patient Active Problem List   Diagnosis Date Noted  . MDD (major depressive disorder), severe (HCC) 01/06/2016  . Hyperlipidemia with target LDL less than 70 09/19/2012  . LV (left ventricular) mural thrombus 09/19/2012  . Systolic CHF, chronic (HCC) 09/18/2012  . Automatic implantable cardioverter-defibrillator in situ 09/18/2012  . Tobacco use 11/16/2010  . CAD (coronary artery disease), native coronary artery 12/01/2009  . Hypothyroidism 12/01/2009    Current Outpatient Medications:  .  aspirin EC 81 MG tablet, Take 1 tablet (81 mg total) by mouth daily. For heart health, Disp: , Rfl:  .  atorvastatin (LIPITOR) 20 MG tablet, Take 1 tablet (20 mg total) by mouth daily., Disp: 30 tablet, Rfl: 5 .  carvedilol (COREG) 6.25 MG tablet, Take 1 tablet (6.25 mg total) by mouth 2 (two) times daily., Disp: 60 tablet, Rfl: 6 .  furosemide (LASIX) 40 MG tablet, Take 1 tablet (40 mg total) by mouth 2 (two) times daily. Take extra 1 tablet (40 mg) as needed for weight gain or shortness of breath., Disp: 60 tablet, Rfl: 11 .  sacubitril-valsartan (ENTRESTO) 97-103 MG, Take 1 tablet by mouth 2 (two) times daily., Disp: 60 tablet, Rfl: 5 .  nicotine (NICODERM CQ - DOSED IN MG/24 HOURS) 21 mg/24hr patch, Place 1 patch (21 mg total) onto the skin daily. For smoking cessation (Patient not taking: Reported on 08/10/2017), Disp: 28 patch, Rfl: 0 .  nitroGLYCERIN (NITROSTAT) 0.4 MG SL tablet, Place 1 tablet (0.4 mg total) under the tongue every 5 (five) minutes as needed for chest pain. (Patient not taking: Reported on 07/27/2017), Disp: 25 tablet, Rfl: 12 .  varenicline (CHANTIX STARTING MONTH PAK) 0.5 MG X 11 & 1 MG X 42 tablet, Please take as directed on label. (Patient not taking: Reported on 08/10/2017), Disp: 53 tablet, Rfl:  0 Allergies  Allergen Reactions  . Bupropion Nausea And Vomiting      Social History   Socioeconomic History  . Marital status: Single    Spouse name: Not on file  . Number of children: Not on file  . Years of education: Not on file  . Highest education level: Not on file  Social Needs  . Financial resource strain: Not on file  . Food insecurity - worry: Not on file  . Food insecurity - inability: Not on file  . Transportation needs - medical: Not on file  . Transportation needs - non-medical: Not on file  Occupational History  . Not on file  Tobacco Use  . Smoking status: Current Every Day Smoker    Packs/day: 1.00    Types: Cigarettes  . Smokeless tobacco: Never Used  Substance and Sexual Activity  . Alcohol use: No  . Drug use: No  . Sexual activity: Not Currently  Other Topics Concern  . Not on file  Social History Narrative  . Not on file    Physical Exam  Constitutional: He is oriented to person, place, and time.  Cardiovascular: Normal rate and regular rhythm.  Pulmonary/Chest: Effort normal and breath sounds normal. No respiratory distress. He has no wheezes. He has no rales.  Musculoskeletal: Normal range of motion. He exhibits no edema.  Neurological: He is alert and oriented to person, place, and time.  Skin: Skin is warm and dry.  Psychiatric: He has a normal mood and affect.        Future Appointments  Date Time Provider Department Center  09/06/2017  2:30 PM [facility] PA/NP [facility] None  11/13/2017  7:20 AM [facility] DEVICE REMOTES [facility]    BP 112/70 (BP Location: Left Arm, Patient Position: Sitting, Cuff Size: Large)   Pulse 70   Resp 16   Wt 209 lb (94.8 kg)   SpO2 97%   BMI 29.15 kg/m   Weight yesterday- 209 lb Last visit weight- 209 lb  Bryan Gardner was seen at his mothers house today and reported feeling well. He stated that he and his girlfriend have been looking for income based housing lately and requested  information if I have any. I will bring him information at our next visit. He reported being compliant with his medications and trying to "cut back" on smoking by eating candy. He has not gotten Chantix or nicotine patches. He did not need anything today and we agreed to meet next week.  Time spent with patient: 23 minutes  Jacqualine Code, EMT 08/25/17  ACTION: Home visit completed Next visit planned for 1 week

## 2017-08-25 ENCOUNTER — Telehealth (HOSPITAL_COMMUNITY): Payer: Self-pay | Admitting: Pharmacist

## 2017-08-25 NOTE — Telephone Encounter (Signed)
Mr. Lew DawesRitter called asking for an Rx to help him sleep. I have left him a VM and advised him to try OTC melatonin or Benadryl in the short term but if he wants an Rx, he needs to see his PCP.   Tyler DeisErika K. Bonnye FavaNicolsen, PharmD, BCPS, CPP Clinical Pharmacist Phone: [phone] 08/25/2017 4:12 PM

## 2017-09-06 ENCOUNTER — Other Ambulatory Visit: Payer: Self-pay

## 2017-09-06 ENCOUNTER — Encounter (HOSPITAL_COMMUNITY): Payer: Self-pay

## 2017-09-06 ENCOUNTER — Ambulatory Visit (HOSPITAL_COMMUNITY)
Admission: RE | Admit: 2017-09-06 | Discharge: 2017-09-06 | Disposition: A | Discharge status: Home / Self Care | Payer: Medicare Other | Source: Ambulatory Visit | Attending: Internal Medicine | Admitting: Internal Medicine

## 2017-09-06 VITALS — BP 108/80 | HR 64 | Wt 201.2 lb

## 2017-09-06 DIAGNOSIS — Z955 Presence of coronary angioplasty implant and graft: Secondary | ICD-10-CM | POA: Diagnosis not present

## 2017-09-06 DIAGNOSIS — Z95810 Presence of automatic (implantable) cardiac defibrillator: Secondary | ICD-10-CM | POA: Diagnosis not present

## 2017-09-06 DIAGNOSIS — Z720 Tobacco use: Secondary | ICD-10-CM | POA: Diagnosis not present

## 2017-09-06 DIAGNOSIS — F17210 Nicotine dependence, cigarettes, uncomplicated: Secondary | ICD-10-CM | POA: Insufficient documentation

## 2017-09-06 DIAGNOSIS — F329 Major depressive disorder, single episode, unspecified: Secondary | ICD-10-CM | POA: Diagnosis not present

## 2017-09-06 DIAGNOSIS — I513 Intracardiac thrombosis, not elsewhere classified: Secondary | ICD-10-CM | POA: Diagnosis not present

## 2017-09-06 DIAGNOSIS — I5022 Chronic systolic (congestive) heart failure: Secondary | ICD-10-CM | POA: Insufficient documentation

## 2017-09-06 DIAGNOSIS — Z7982 Long term (current) use of aspirin: Secondary | ICD-10-CM | POA: Insufficient documentation

## 2017-09-06 DIAGNOSIS — Z9119 Patient's noncompliance with other medical treatment and regimen: Secondary | ICD-10-CM | POA: Diagnosis not present

## 2017-09-06 DIAGNOSIS — I2510 Atherosclerotic heart disease of native coronary artery without angina pectoris: Secondary | ICD-10-CM | POA: Diagnosis not present

## 2017-09-06 DIAGNOSIS — I252 Old myocardial infarction: Secondary | ICD-10-CM | POA: Diagnosis not present

## 2017-09-06 DIAGNOSIS — I240 Acute coronary thrombosis not resulting in myocardial infarction: Secondary | ICD-10-CM

## 2017-09-06 DIAGNOSIS — Z79899 Other long term (current) drug therapy: Secondary | ICD-10-CM | POA: Insufficient documentation

## 2017-09-06 MED ORDER — FUROSEMIDE 40 MG PO TABS: 40.0000 mg | ORAL_TABLET | Freq: Two times a day (BID) | ORAL | 11 refills | Status: DC

## 2017-09-06 MED ORDER — SPIRONOLACTONE 25 MG PO TABS: 12.5000 mg | ORAL_TABLET | Freq: Every day | ORAL | 3 refills | Status: DC

## 2017-09-06 MED ORDER — WARFARIN SODIUM 5 MG PO TABS: 5.0000 mg | ORAL_TABLET | Freq: Every day | ORAL | 11 refills | Status: DC

## 2017-09-06 NOTE — Patient Instructions (Addendum)
Will schedule you for Cardiopulmonary Exercise Test. This test is done at our Heart Failure Clinic. Please wear comfortable clothes and shoes for this test. Avoid heavy meal before the test (light snack/meal recommended). Avoid caffeine, alcohol, tobacco products 12 hrs before test. Please give 24 hr notice for cancellations/rescheduling: [phone].  INCREASE Lasix to 40 mg TWICE daily.  START Spironolactone 12.5 mg (1/2 tablet) once daily.  Will refer [facility] to [facility] at [facility]. Address: [address] ([address]), [place], [place] [address]  Phone: [phone]  Labs next week at Coumadin clinic appointment.  Follow up 4-6 weeks with Amy Clegg NP-C.  Take all medication as prescribed the day of your appointment. Bring all medications with you to your appointment.  Do the following things EVERYDAY: 1) Weigh yourself in the morning before breakfast. Write it down and keep it in a log. 2) Take your medicines as prescribed 3) Eat low salt foods-Limit salt (sodium) to 2000 mg per day.  4) Stay as active as you can everyday 5) Limit all fluids for the day to less than 2 liters

## 2017-09-06 NOTE — Progress Notes (Signed)
ADVANCED HF CLINIC   Referring Physician: S. Waters NP [facility]) Primary Care: none Primary Cardiologist: Ilda Foil MD; Caryl Ada [facility] HF at [facility]   HPI: Bryan Gardner is a 37 yo with a history of tobacco use, depression. CAD s/p previous anterior MI (at age 58), chronic systolic HF (EF 25%) s/p STJ ICD who is referred by Caryl Ada NP-C at [facility] to establish care in the HF Clinic in [place].   He experienced anterior MI at age 80 with apparent late presentation. Unnderwent  PCI of LAD but stent later found to be totally occluded with no viability on cMRI.  Today he returns for HF follow up. He has been followed by Paramedicine. Overall feeling fine. SOB after walking 1 block. Denies/PND/Orthopnea. No chest pain. Appetite ok. No fever or chills. Weight at home 204-209  Pounds.Smoking 6 -7 cigarettes per day. Taking all medications. Lives with fiance. Has some difficulty with transportation.   Cath / PCI: --s/p PCI w/ DES to the LAD 03/2009 at [facility]  --totally occluded LAD on cath 11/2009 at [facility]    ECHO 07/05/2017 Left ventricle: Wall thickness was increased in a pattern of   severe LVH. Systolic function was severely reduced. The estimated   ejection fraction was in the range of 10% to 15%. Akinesis of the   apicalanteroseptal, lateral, inferior, and apical myocardium.   Features are consistent with a pseudonormal left ventricular   filling pattern, with concomitant abnormal relaxation and   increased filling pressure (grade 2 diastolic dysfunction). There   was a large thrombus  6/11 cMRI  1) EF 26% 2)Left anterior descending coronary arterial distribution infarct involving the entirety of the anterior cardiac wall, portions of the intraventricular septum and posterior inferior cardiac wall, with circumferential involvement and aneurysmal dilatation of the cardiac apex. This is associated with marked thinning and akinesis of the anterior  wall .3)Mural thrombus identified along the anterior cardiac wall and within the apex.   Past Medical History:  Diagnosis Date  . Myocardial infarction [facility])     Current Outpatient Medications  Medication Sig Dispense Refill  . aspirin EC 81 MG tablet Take 1 tablet (81 mg total) by mouth daily. For heart health    . atorvastatin (LIPITOR) 20 MG tablet Take 1 tablet (20 mg total) by mouth daily. 30 tablet 5  . carvedilol (COREG) 6.25 MG tablet Take 1 tablet (6.25 mg total) by mouth 2 (two) times daily. 60 tablet 6  . furosemide (LASIX) 40 MG tablet Take 1 tablet (40 mg total) by mouth 2 (two) times daily. Take extra 1 tablet (40 mg) as needed for weight gain or shortness of breath. 60 tablet 11  . nicotine (NICODERM CQ - DOSED IN MG/24 HOURS) 21 mg/24hr patch Place 1 patch (21 mg total) onto the skin daily. For smoking cessation (Patient not taking: Reported on 08/10/2017) 28 patch 0  . nitroGLYCERIN (NITROSTAT) 0.4 MG SL tablet Place 1 tablet (0.4 mg total) under the tongue every 5 (five) minutes as needed for chest pain. (Patient not taking: Reported on 07/27/2017) 25 tablet 12  . sacubitril-valsartan (ENTRESTO) 97-103 MG Take 1 tablet by mouth 2 (two) times daily. 60 tablet 5  . varenicline (CHANTIX STARTING MONTH PAK) 0.5 MG X 11 & 1 MG X 42 tablet Please take as directed on label. (Patient not taking: Reported on 08/10/2017) 53 tablet 0   No current facility-administered medications for this encounter.     Allergies  Allergen Reactions  .  Bupropion Nausea And Vomiting      Social History   Socioeconomic History  . Marital status: Single    Spouse name: Not on file  . Number of children: Not on file  . Years of education: Not on file  . Highest education level: Not on file  Occupational History  . Not on file  Social Needs  . Financial resource strain: Not on file  . Food insecurity:    Worry: Not on file    Inability: Not on file  . Transportation needs:    Medical:  Not on file    Non-medical: Not on file  Tobacco Use  . Smoking status: Current Every Day Smoker    Packs/day: 1.00    Types: Cigarettes  . Smokeless tobacco: Never Used  Substance and Sexual Activity  . Alcohol use: No  . Drug use: No  . Sexual activity: Not Currently  Lifestyle  . Physical activity:    Days per week: Not on file    Minutes per session: Not on file  . Stress: Not on file  Relationships  . Social connections:    Talks on phone: Not on file    Gets together: Not on file    Attends religious service: Not on file    Active member of club or organization: Not on file    Attends meetings of clubs or organizations: Not on file    Relationship status: Not on file  . Intimate partner violence:    Fear of current or ex partner: Not on file    Emotionally abused: Not on file    Physically abused: Not on file    Forced sexual activity: Not on file  Other Topics Concern  . Not on file  Social History Narrative  . Not on file     No family history on file.  Vitals:   09/06/17 1445  BP: 108/80  Pulse: 64  SpO2: 95%  Weight: 201 lb 3.2 oz (91.3 kg)    PHYSICAL EXAM: General:  Well appearing. No resp difficulty HEENT: normal Neck: supple. no JVD. Carotids 2+ bilat; no bruits. No lymphadenopathy or thryomegaly appreciated. Cor: PMI nondisplaced. Regular rate & rhythm. No rubs, gallops or murmurs. Lungs: clear Abdomen: soft, nontender, nondistended. No hepatosplenomegaly. No bruits or masses. Good bowel sounds. Extremities: no cyanosis, clubbing, rash, edema Neuro: alert & orientedx3, cranial nerves grossly intact. moves all 4 extremities w/o difficulty. Affect pleasant     ASSESSMENT & PLAN:  1. Systolic CHF, chronic due to Ischemic heart disease. S/p STJ ICD - last EF 26% by cMRI in 6/11 with no viability in LAD territory - ECHO 07/05/2017 EF 10-15% Grade II DD with LV thrombus  -NYHA III. Volume status elevated. Increase lasix to 40 mg twice a day.  -   Continue entresto 49-51 mg twice a day. - Continue  carvedilol 3.125 bid -add 12.5 mg spironolactone daily.  - Set up CPX today.  - Referred to VAD coordinator today. [facility] but he needed to stop smoking. Says he has support from fiance and mom. Also said he wants to get better because he has a 39 year old daughter.   2. CAD (coronary artery disease) - History of anterior STEMI with late presentation  - s/p PCI of LAD with reocclusion of stent - no s/s ischemia  - Continue ASA 81, crestor 20  3. H/o LV thrombus - this is chronic non-compliant with AC in past - large thrombus  noted 06/2017  - Discussed at length. He would is agreeable to coumadin. Start 5 mg coumadin daily. He was referred to the Coumadin Clinic. Coumadin Clinic next Monday.   4. Depression - has h/o suicide attempt after his relationship broke up.  - currently not suicidal.  - In the past he was on SSRI   5. Tobacco abuse Discussed smoking cessation.    'Set up CPX.Follow up in 4 weeks to discuss CPX results. I asked VAD Coordinator to meet with him today. BMET next week.    Tonye Becket, NP  2:46 PM

## 2017-09-06 NOTE — Progress Notes (Signed)
VAD Coordinator Note  Bryan Gardner is a 37 y.o. with  has a past medical history of Myocardial infarction (HCC).. Currently being evaluated for advanced therapies which include Left Ventricular Assist Device implantation.   Have had lengthy discussion with patient about the risks, benefits, indications and alternatives to Heartmate III VAD placement. I have reviewed in full the following; caregiver role, survival, complications, life style changes, follow up care, discharge planning, rehabilitation, VAD dressing supplies and driveline management care. The patient was provided with educational resources to take home and read more about the device offered. The patient understands that from this discussion it does not mean that they will receive the device, but that depends on an extensive evaluation process.   I have provided the patient with my contact information in order for them to call back if they have any questions.  Bryan BecketAmy Clegg, NP is scheduling the pt for CPX testing and 4 week follow up in APP clinic. We will add the pt to our MRB agenda for Monday.   Carlton AdamSarah Arynn Armand RN, BSN VAD Coordinator 24/7 Pager [phone]

## 2017-09-07 ENCOUNTER — Telehealth (HOSPITAL_COMMUNITY): Payer: Self-pay | Admitting: Student-PharmD

## 2017-09-07 NOTE — Telephone Encounter (Signed)
Bryan Gardner left voicemail asking for advice on food interactions with warfarin since he was recently started on this medication. Returned patient called and informed him that he didn't need to avoid foods, but keep his green leafy vegetables consistent from week to week. I also let him know that they would be discussing this at his follow up appointment on Monday. He acknowledged understanding and thanked me for the information.

## 2017-09-11 ENCOUNTER — Ambulatory Visit (INDEPENDENT_AMBULATORY_CARE_PROVIDER_SITE_OTHER): Payer: Medicare Other | Admitting: *Deleted

## 2017-09-11 DIAGNOSIS — I5022 Chronic systolic (congestive) heart failure: Secondary | ICD-10-CM

## 2017-09-11 DIAGNOSIS — Z5181 Encounter for therapeutic drug level monitoring: Secondary | ICD-10-CM | POA: Diagnosis not present

## 2017-09-11 LAB — POCT INR: INR: 1.4

## 2017-09-11 NOTE — Patient Instructions (Addendum)
A full discussion of the nature of anticoagulants has been carried out.  A benefit risk analysis has been presented to the patient, so that they understand the justification for choosing anticoagulation at this time. The need for frequent and regular monitoring, precise dosage adjustment and compliance is stressed.  Side effects of potential bleeding are discussed.  The patient should avoid any OTC items containing aspirin or ibuprofen, and should avoid great swings in general diet.  Avoid alcohol consumption.  Call if any signs of abnormal bleeding.   Description   Today take an extra 1/2 tablet, then change your dose to 1 tablet everyday except 1.5 tablets on Mondays and Fridays. Recheck in one week. Coumadin Clinic [phone] Main [phone]

## 2017-09-14 ENCOUNTER — Other Ambulatory Visit (HOSPITAL_COMMUNITY): Payer: Self-pay

## 2017-09-14 NOTE — Progress Notes (Signed)
Paramedicine Encounter    Patient ID: Bryan FoleyDavid M Gardner, male    DOB: 1980/12/25, 37 y.o.   MRN: [id number]   Patient Care Team: Patient, No Pcp Per as PCP - General (General Practice)  Patient Active Problem List   Diagnosis Date Noted  . Encounter for therapeutic drug monitoring 09/11/2017  . MDD (major depressive disorder), severe (HCC) 01/06/2016  . Hyperlipidemia with target LDL less than 70 09/19/2012  . LV (left ventricular) mural thrombus 09/19/2012  . Systolic CHF, chronic (HCC) 09/18/2012  . Automatic implantable cardioverter-defibrillator in situ 09/18/2012  . Tobacco use 11/16/2010  . CAD (coronary artery disease), native coronary artery 12/01/2009  . Hypothyroidism 12/01/2009    Current Outpatient Medications:  .  aspirin EC 81 MG tablet, Take 1 tablet (81 mg total) by mouth daily. For heart health, Disp: , Rfl:  .  atorvastatin (LIPITOR) 20 MG tablet, Take 1 tablet (20 mg total) by mouth daily., Disp: 30 tablet, Rfl: 5 .  carvedilol (COREG) 6.25 MG tablet, Take 1 tablet (6.25 mg total) by mouth 2 (two) times daily., Disp: 60 tablet, Rfl: 6 .  furosemide (LASIX) 40 MG tablet, Take 1 tablet (40 mg total) by mouth 2 (two) times daily., Disp: 60 tablet, Rfl: 11 .  sacubitril-valsartan (ENTRESTO) 97-103 MG, Take 1 tablet by mouth 2 (two) times daily., Disp: 60 tablet, Rfl: 5 .  spironolactone (ALDACTONE) 25 MG tablet, Take 0.5 tablets (12.5 mg total) by mouth daily., Disp: 45 tablet, Rfl: 3 .  warfarin (COUMADIN) 5 MG tablet, Take 1 tablet (5 mg total) by mouth daily., Disp: 30 tablet, Rfl: 11 .  nitroGLYCERIN (NITROSTAT) 0.4 MG SL tablet, Place 1 tablet (0.4 mg total) under the tongue every 5 (five) minutes as needed for chest pain. (Patient not taking: Reported on 09/14/2017), Disp: 25 tablet, Rfl: 12 Allergies  Allergen Reactions  . Bupropion Nausea And Vomiting      Social History   Socioeconomic History  . Marital status: Single    Spouse name: Not on file  . Number  of children: Not on file  . Years of education: Not on file  . Highest education level: Not on file  Occupational History  . Not on file  Social Needs  . Financial resource strain: Not on file  . Food insecurity:    Worry: Not on file    Inability: Not on file  . Transportation needs:    Medical: Not on file    Non-medical: Not on file  Tobacco Use  . Smoking status: Current Every Day Smoker    Packs/day: 1.00    Types: Cigarettes  . Smokeless tobacco: Never Used  Substance and Sexual Activity  . Alcohol use: No  . Drug use: No  . Sexual activity: Not Currently  Lifestyle  . Physical activity:    Days per week: Not on file    Minutes per session: Not on file  . Stress: Not on file  Relationships  . Social connections:    Talks on phone: Not on file    Gets together: Not on file    Attends religious service: Not on file    Active member of club or organization: Not on file    Attends meetings of clubs or organizations: Not on file    Relationship status: Not on file  . Intimate partner violence:    Fear of current or ex partner: Not on file    Emotionally abused: Not on file    Physically abused:  Not on file    Forced sexual activity: Not on file  Other Topics Concern  . Not on file  Social History Narrative  . Not on file    Physical Exam  Constitutional: He is oriented to person, place, and time.  Cardiovascular: Normal rate and regular rhythm.  Pulmonary/Chest: Effort normal and breath sounds normal. No respiratory distress. He has no wheezes. He has no rales.  Abdominal: Soft. He exhibits no distension.  Musculoskeletal: Normal range of motion. He exhibits no edema.  Neurological: He is alert and oriented to person, place, and time.  Skin: Skin is warm and dry.  Psychiatric: He has a normal mood and affect.        Future Appointments  Date Time Provider Department Center  09/18/2017 11:30 AM [facility]   09/18/2017  2:00 PM [facility] LAB [facility]  10/18/2017 11:30 AM [facility] PA/NP [facility] None  11/13/2017  7:20 AM [facility] DEVICE REMOTES [facility]    BP 102/80 (BP Location: Left Arm, Patient Position: Sitting, Cuff Size: Large)   Pulse 76   Resp 16   Wt 209 lb (94.8 kg)   SpO2 97%   BMI 29.15 kg/m   Weight yesterday- 208 lb Last visit weight-   Bryan Gardner was seen at home today and reported feeling well. He denied increased SOB, headache, dizziness or orthopnea. He reported being compliant with his medications but was discouraged to find out that his heart was not getting any stronger. He reported that he is being considered for an LVAD in the near future. We spoke about the device briefly and I suggested he gather a list of questions to ask the coordniators at his meeting next month. He also has a CPX test next week and plans to stop smoking the day before the test. No other information to report.  Time spent with patient: 30 minutes  Jacqualine Code, EMT 09/14/17  ACTION: Home visit completed Next visit planned for 1 week

## 2017-09-18 ENCOUNTER — Ambulatory Visit (HOSPITAL_COMMUNITY)
Admission: RE | Admit: 2017-09-18 | Discharge: 2017-09-18 | Disposition: A | Discharge status: Home / Self Care | Payer: Medicare Other | Source: Ambulatory Visit | Attending: Internal Medicine | Admitting: Internal Medicine

## 2017-09-18 ENCOUNTER — Ambulatory Visit (HOSPITAL_COMMUNITY): Payer: Medicare Other

## 2017-09-18 ENCOUNTER — Ambulatory Visit (INDEPENDENT_AMBULATORY_CARE_PROVIDER_SITE_OTHER): Payer: Medicare Other | Admitting: *Deleted

## 2017-09-18 ENCOUNTER — Other Ambulatory Visit (HOSPITAL_COMMUNITY): Payer: Self-pay | Admitting: *Deleted

## 2017-09-18 DIAGNOSIS — I5022 Chronic systolic (congestive) heart failure: Secondary | ICD-10-CM | POA: Diagnosis not present

## 2017-09-18 DIAGNOSIS — Z5181 Encounter for therapeutic drug level monitoring: Secondary | ICD-10-CM

## 2017-09-18 LAB — BASIC METABOLIC PANEL
ANION GAP: 12 (ref 5–15)
BUN: 9 mg/dL (ref 6–20)
CHLORIDE: 102 mmol/L (ref 101–111)
CO2: 21 mmol/L — AB (ref 22–32)
Calcium: 9.2 mg/dL (ref 8.9–10.3)
Creatinine, Ser: 1.08 mg/dL (ref 0.61–1.24)
GFR calc non Af Amer: >60 mL/min (ref >60)
GLUCOSE: 127 mg/dL — AB (ref 65–99)
Potassium: 4.1 mmol/L (ref 3.5–5.1)
Sodium: 135 mmol/L (ref 135–145)

## 2017-09-18 LAB — POCT INR: INR: 2.5

## 2017-09-18 NOTE — Patient Instructions (Signed)
Description   Continue taking 1 tablet everyday except 1.5 tablets on Mondays and Fridays. Recheck in one week. [facility]in Clinic [phone] Main [phone]

## 2017-09-27 ENCOUNTER — Ambulatory Visit (INDEPENDENT_AMBULATORY_CARE_PROVIDER_SITE_OTHER): Payer: Medicare Other | Admitting: *Deleted

## 2017-09-27 DIAGNOSIS — Z5181 Encounter for therapeutic drug level monitoring: Secondary | ICD-10-CM

## 2017-09-27 LAB — POCT INR: INR: 1.7

## 2017-09-27 NOTE — Patient Instructions (Signed)
Description   Today April 17th take another 1/2 tablet as he has taken 1 tablet this morning then continue taking 1 tablet everyday except 1.5 tablets on Mondays and Fridays. Recheck in one week. [facility] Clinic [phone] Main [phone]

## 2017-09-28 ENCOUNTER — Telehealth (HOSPITAL_COMMUNITY): Payer: Self-pay

## 2017-09-28 NOTE — Telephone Encounter (Signed)
Result Notes for Cardiopulmonary exercise test   Notes recorded by Chyrl CivatteBradley, Chizaram Latino G, RN on 09/28/2017 at 3:53 PM EDT Attempted to call, rings continuously without VM option ------  Notes recorded by Sherald Hesslegg, Amy D, NP on 09/27/2017 at 12:36 PM EDT Mild heart failure limitation. Please call.

## 2017-09-28 NOTE — Telephone Encounter (Signed)
[name] called me this morning to advise he was going to be unable to make our scheduled appointment due to transportation issues. He had taken his girlfriend to work and his scooter broke down before he got home. He said tomorrow would be better to meet so we agreed on sometime after 09:30.

## 2017-09-29 ENCOUNTER — Other Ambulatory Visit (HOSPITAL_COMMUNITY): Payer: Self-pay | Admitting: *Deleted

## 2017-09-29 ENCOUNTER — Other Ambulatory Visit (HOSPITAL_COMMUNITY): Payer: Self-pay

## 2017-09-29 ENCOUNTER — Telehealth (HOSPITAL_COMMUNITY): Payer: Self-pay

## 2017-09-29 MED ORDER — ATORVASTATIN CALCIUM 20 MG PO TABS: 20.0000 mg | ORAL_TABLET | Freq: Every day | ORAL | 2 refills | Status: DC

## 2017-09-29 NOTE — Progress Notes (Signed)
Paramedicine Encounter    Patient ID: Bryan Gardner, male    DOB: [birth year], 37 y.o.   MRN: [id number]   Patient Care Team: Patient, No Pcp Per as PCP - General (General Practice)  Patient Active Problem List   Diagnosis Date Noted  . Encounter for therapeutic drug monitoring 09/11/2017  . MDD (major depressive disorder), severe (HCC) 01/06/2016  . Hyperlipidemia with target LDL less than 70 09/19/2012  . LV (left ventricular) mural thrombus 09/19/2012  . Systolic CHF, chronic (HCC) 09/18/2012  . Automatic implantable cardioverter-defibrillator in situ 09/18/2012  . Tobacco use 11/16/2010  . CAD (coronary artery disease), native coronary artery 12/01/2009  . Hypothyroidism 12/01/2009    Current Outpatient Medications:  .  aspirin EC 81 MG tablet, Take 1 tablet (81 mg total) by mouth daily. For heart health, Disp: , Rfl:  .  atorvastatin (LIPITOR) 20 MG tablet, Take 1 tablet (20 mg total) by mouth daily., Disp: 30 tablet, Rfl: 5 .  furosemide (LASIX) 40 MG tablet, Take 1 tablet (40 mg total) by mouth 2 (two) times daily., Disp: 60 tablet, Rfl: 11 .  sacubitril-valsartan (ENTRESTO) 97-103 MG, Take 1 tablet by mouth 2 (two) times daily., Disp: 60 tablet, Rfl: 5 .  spironolactone (ALDACTONE) 25 MG tablet, Take 0.5 tablets (12.5 mg total) by mouth daily., Disp: 45 tablet, Rfl: 3 .  warfarin (COUMADIN) 5 MG tablet, Take 1 tablet (5 mg total) by mouth daily., Disp: 30 tablet, Rfl: 11 .  carvedilol (COREG) 6.25 MG tablet, Take 1 tablet (6.25 mg total) by mouth 2 (two) times daily. (Patient not taking: Reported on 09/29/2017), Disp: 60 tablet, Rfl: 6 .  nitroGLYCERIN (NITROSTAT) 0.4 MG SL tablet, Place 1 tablet (0.4 mg total) under the tongue every 5 (five) minutes as needed for chest pain. (Patient not taking: Reported on 09/14/2017), Disp: 25 tablet, Rfl: 12 Allergies  Allergen Reactions  . Bupropion Nausea And Vomiting      Social History   Socioeconomic History  . Marital status:  Single    Spouse name: Not on file  . Number of children: Not on file  . Years of education: Not on file  . Highest education level: Not on file  Occupational History  . Not on file  Social Needs  . Financial resource strain: Not on file  . Food insecurity:    Worry: Not on file    Inability: Not on file  . Transportation needs:    Medical: Not on file    Non-medical: Not on file  Tobacco Use  . Smoking status: Current Every Day Smoker    Packs/day: 1.00    Types: Cigarettes  . Smokeless tobacco: Never Used  Substance and Sexual Activity  . Alcohol use: No  . Drug use: No  . Sexual activity: Not Currently  Lifestyle  . Physical activity:    Days per week: Not on file    Minutes per session: Not on file  . Stress: Not on file  Relationships  . Social connections:    Talks on phone: Not on file    Gets together: Not on file    Attends religious service: Not on file    Active member of club or organization: Not on file    Attends meetings of clubs or organizations: Not on file    Relationship status: Not on file  . Intimate partner violence:    Fear of current or ex partner: Not on file    Emotionally abused: Not on  file    Physically abused: Not on file    Forced sexual activity: Not on file  Other Topics Concern  . Not on file  Social History Narrative  . Not on file    Physical Exam      Future Appointments  Date Time Provider Department Center  10/04/2017 11:00 AM [facility]  10/18/2017 11:30 AM [facility] PA/NP [facility] None  11/13/2017  7:20 AM [facility] DEVICE REMOTES [facility]    BP 108/72 (BP Location: Left Arm, Patient Position: Sitting, Cuff Size: Large)   Pulse 72   Resp 16   Wt 203 lb (92.1 kg)   SpO2 97%   BMI 28.31 kg/m   Weight yesterday- 203 lb Last visit weight- 209 lb  [name] was seen at home today and reported feeling well. He stated he has been compliant with his medications  however upon looking over his pillbox and medication bottles, it was found that he did not have carvedilol. He stated he did not recall picking it up but when I called the pharmacy they advised it was picked up on April 14. He stated he would ask his girlfriend when he got home if she knew where it was and call me to let me know if he needed help getting it. He denied SOB, headache, dizziness or orthopnea.   Time spent with patient: 28 minutes   Jacqualine CodeZackery R Symphany Fleissner, EMT 09/29/17  ACTION: Home visit completed Next visit planned for 1 week

## 2017-09-29 NOTE — Telephone Encounter (Signed)
Result Notes for Cardiopulmonary exercise test   Notes recorded by Chyrl CivatteBradley, Jenilyn Magana G, RN on 09/29/2017 at 1:53 PM EDT Attempted to call second time, rings continuously without VM option ------  Notes recorded by Chyrl CivatteBradley, Fumiye Lubben G, RN on 09/28/2017 at 3:53 PM EDT Attempted to call, rings continuously without VM option ------  Notes recorded by Sherald Hesslegg, Amy D, NP on 09/27/2017 at 12:36 PM EDT Mild heart failure limitation. Please call.

## 2017-10-02 ENCOUNTER — Telehealth (HOSPITAL_COMMUNITY): Payer: Self-pay

## 2017-10-02 NOTE — Telephone Encounter (Signed)
Result Notes for Cardiopulmonary exercise test   Notes recorded by Chyrl CivatteBradley, Megan G, RN on 10/02/2017 at 12:04 PM EDT 3rd attempt unsuccessful, still no VM option, added note to next week's appt to discuss with patient at that time. ------  Notes recorded by Chyrl CivatteBradley, Megan G, RN on 09/29/2017 at 1:53 PM EDT Attempted to call second time, rings continuously without VM option ------  Notes recorded by Chyrl CivatteBradley, Megan G, RN on 09/28/2017 at 3:53 PM EDT Attempted to call, rings continuously without VM option ------  Notes recorded by Sherald Hesslegg, Amy D, NP on 09/27/2017 at 12:36 PM EDT Mild heart failure limitation. Please call.

## 2017-10-03 ENCOUNTER — Other Ambulatory Visit (HOSPITAL_COMMUNITY): Payer: Self-pay | Admitting: Pharmacist

## 2017-10-03 MED ORDER — CARVEDILOL 6.25 MG PO TABS: 6.2500 mg | ORAL_TABLET | Freq: Two times a day (BID) | ORAL | 6 refills | Status: DC

## 2017-10-04 ENCOUNTER — Ambulatory Visit (INDEPENDENT_AMBULATORY_CARE_PROVIDER_SITE_OTHER): Payer: Medicare Other | Admitting: *Deleted

## 2017-10-04 DIAGNOSIS — Z5181 Encounter for therapeutic drug level monitoring: Secondary | ICD-10-CM | POA: Diagnosis not present

## 2017-10-04 LAB — POCT INR: INR: 2.9

## 2017-10-04 NOTE — Patient Instructions (Signed)
Description   Continue taking 1 tablet everyday except 1.5 tablets on Mondays and Fridays. Recheck in one week. [facility]madin Clinic [phone] Main [phone]     

## 2017-10-05 ENCOUNTER — Other Ambulatory Visit (HOSPITAL_COMMUNITY): Payer: Self-pay

## 2017-10-05 MED ORDER — CARVEDILOL 6.25 MG PO TABS: 6.2500 mg | ORAL_TABLET | Freq: Two times a day (BID) | ORAL | 6 refills | Status: DC

## 2017-10-10 ENCOUNTER — Telehealth (HOSPITAL_COMMUNITY): Payer: Self-pay

## 2017-10-11 ENCOUNTER — Ambulatory Visit (INDEPENDENT_AMBULATORY_CARE_PROVIDER_SITE_OTHER): Payer: Medicare Other | Admitting: *Deleted

## 2017-10-11 DIAGNOSIS — Z5181 Encounter for therapeutic drug level monitoring: Secondary | ICD-10-CM | POA: Diagnosis not present

## 2017-10-11 LAB — POCT INR: INR: 2.8

## 2017-10-11 NOTE — Patient Instructions (Signed)
Description   Continue taking 1 tablet everyday except 1.5 tablets on Mondays and Fridays. Recheck in 2 weeks. [facility]in Clinic [phone] Main [phone]

## 2017-10-11 NOTE — Telephone Encounter (Signed)
I called Bryan Gardner to schedule an appointment. He did not answer so I left a voicemail requesting he call me back.

## 2017-10-12 ENCOUNTER — Telehealth (HOSPITAL_COMMUNITY): Payer: Self-pay

## 2017-10-12 NOTE — Telephone Encounter (Signed)
I called Bryan Gardner today to try scheduling an appointment. He did not answer the phone so I left a voicemail requesting he call me back.

## 2017-10-18 ENCOUNTER — Ambulatory Visit (HOSPITAL_COMMUNITY)
Admission: RE | Admit: 2017-10-18 | Discharge: 2017-10-18 | Disposition: A | Discharge status: Home / Self Care | Payer: Medicare Other | Source: Ambulatory Visit | Attending: Cardiology | Admitting: Cardiology

## 2017-10-18 ENCOUNTER — Encounter (HOSPITAL_COMMUNITY): Payer: Self-pay

## 2017-10-18 ENCOUNTER — Other Ambulatory Visit (HOSPITAL_COMMUNITY): Payer: Self-pay

## 2017-10-18 VITALS — BP 118/86 | HR 64 | Wt 202.6 lb

## 2017-10-18 DIAGNOSIS — Z915 Personal history of self-harm: Secondary | ICD-10-CM | POA: Diagnosis not present

## 2017-10-18 DIAGNOSIS — Z86718 Personal history of other venous thrombosis and embolism: Secondary | ICD-10-CM | POA: Insufficient documentation

## 2017-10-18 DIAGNOSIS — I5022 Chronic systolic (congestive) heart failure: Secondary | ICD-10-CM | POA: Insufficient documentation

## 2017-10-18 DIAGNOSIS — F17210 Nicotine dependence, cigarettes, uncomplicated: Secondary | ICD-10-CM | POA: Diagnosis not present

## 2017-10-18 DIAGNOSIS — Z09 Encounter for follow-up examination after completed treatment for conditions other than malignant neoplasm: Secondary | ICD-10-CM | POA: Diagnosis not present

## 2017-10-18 DIAGNOSIS — Z7901 Long term (current) use of anticoagulants: Secondary | ICD-10-CM | POA: Diagnosis not present

## 2017-10-18 DIAGNOSIS — Z79899 Other long term (current) drug therapy: Secondary | ICD-10-CM | POA: Diagnosis not present

## 2017-10-18 DIAGNOSIS — Z720 Tobacco use: Secondary | ICD-10-CM

## 2017-10-18 DIAGNOSIS — I252 Old myocardial infarction: Secondary | ICD-10-CM | POA: Insufficient documentation

## 2017-10-18 DIAGNOSIS — F322 Major depressive disorder, single episode, severe without psychotic features: Secondary | ICD-10-CM | POA: Diagnosis not present

## 2017-10-18 DIAGNOSIS — Z888 Allergy status to other drugs, medicaments and biological substances status: Secondary | ICD-10-CM | POA: Insufficient documentation

## 2017-10-18 DIAGNOSIS — I240 Acute coronary thrombosis not resulting in myocardial infarction: Secondary | ICD-10-CM

## 2017-10-18 DIAGNOSIS — I513 Intracardiac thrombosis, not elsewhere classified: Secondary | ICD-10-CM | POA: Diagnosis not present

## 2017-10-18 DIAGNOSIS — Z7982 Long term (current) use of aspirin: Secondary | ICD-10-CM | POA: Diagnosis not present

## 2017-10-18 DIAGNOSIS — R0602 Shortness of breath: Secondary | ICD-10-CM | POA: Insufficient documentation

## 2017-10-18 DIAGNOSIS — I2510 Atherosclerotic heart disease of native coronary artery without angina pectoris: Secondary | ICD-10-CM

## 2017-10-18 DIAGNOSIS — Z955 Presence of coronary angioplasty implant and graft: Secondary | ICD-10-CM | POA: Insufficient documentation

## 2017-10-18 DIAGNOSIS — Z9119 Patient's noncompliance with other medical treatment and regimen: Secondary | ICD-10-CM | POA: Diagnosis not present

## 2017-10-18 DIAGNOSIS — F329 Major depressive disorder, single episode, unspecified: Secondary | ICD-10-CM | POA: Diagnosis not present

## 2017-10-18 DIAGNOSIS — Z95810 Presence of automatic (implantable) cardiac defibrillator: Secondary | ICD-10-CM

## 2017-10-18 MED ORDER — FUROSEMIDE 40 MG PO TABS: 40.0000 mg | ORAL_TABLET | Freq: Every day | ORAL | 11 refills | Status: AC

## 2017-10-18 MED ORDER — SERTRALINE HCL 100 MG PO TABS
100.0000 mg | ORAL_TABLET | Freq: Every day | ORAL | 0 refills | Status: DC
Start: 2017-10-18 — End: 2017-11-22

## 2017-10-18 NOTE — Patient Instructions (Signed)
RESTART Lasix 40 mg tablet once daily.  Will refer to Bryan Gardner CHF social worker for primary care referral and goals of care.  Follow up 4 weeks with Amy Clegg NP-C.  _____________________________________________________________________ [place] Code: 1300  Take all medication as prescribed the day of your appointment. Bring all medications with you to your appointment.  Do the following things EVERYDAY: 1) Weigh yourself in the morning before breakfast. Write it down and keep it in a log. 2) Take your medicines as prescribed 3) Eat low salt foods-Limit salt (sodium) to 2000 mg per day.  4) Stay as active as you can everyday 5) Limit all fluids for the day to less than 2 liters

## 2017-10-18 NOTE — Progress Notes (Signed)
Paramedicine Encounter   Patient ID: Bryan Gardner , male,   DOB: 04/03/1981,36 y.o.,  MRN: [id number]  Bryan Gardner was seen in the clinic today with Tonye Becket, NP. He has not been taking his medications as prescribed and he stated that he has not called me back over the past two weeks because he was trying to "not think about his heart problems." Amy reported that she did not want to change anything until he is consistently taking his medications. He agreed to see me tomorrow at 09:30.   Time spent with patient: 36 minutes   Jacqualine Code, EMT 10/18/2017   ACTION: Next visit planned for 1 week

## 2017-10-18 NOTE — Progress Notes (Signed)
[facility]   Referring Physician: S. Waters NP [facility]) Primary Care: none Primary Cardiologist: Ilda Foil MD; Caryl Ada [facility] HF at [facility]: Dr Gala Romney  HPI: [name] is a 37 yo with a history of tobacco use, depression. CAD s/p previous anterior MI (at age 34), chronic systolic HF (EF 25%) s/p STJ ICD who is referred by Caryl Ada NP-C at [facility] to establish care in the HF Clinic in [place].   He experienced anterior MI at age 48 with apparent late presentation. Unnderwent  PCI of LAD but stent later found to be totally occluded with no viability on cMRI.  Today he returns for HF follow up. Last visit he was started on 12.5 mg spiro and referred to VAD coordinators. He has only been taking medications once daily and not taking entresto or carvedilol twice a day. He says sometimes he just doesn't want to take the medications because he is depressed. Denies PND/Orthopnea. SOB with steps.  Smoking 1 PPD. He has been compliant with coumadin. No bleeding issues. Followed by Pararmedicine but has been hard to get in touch with so he has not been seen in the last 2 weeks. Lives with his girl friend and a few friends they all smoke several packs of cigarettes a day.   CPX 09/2017  Peak VO2: 24.2 (66% predicted peak VO2) VE/VCO2 slope: 27 OUES: 2.87 Peak RER: 1.05  Cath / PCI: --s/p PCI w/ DES to the LAD 03/2009 at [facility]  --totally occluded LAD on cath 11/2009 at [facility]    ECHO 07/05/2017 Left ventricle: Wall thickness was increased in a pattern of   severe LVH. Systolic function was severely reduced. The estimated   ejection fraction was in the range of 10% to 15%. Akinesis of the   apicalanteroseptal, lateral, inferior, and apical myocardium.   Features are consistent with a pseudonormal left ventricular   filling pattern, with concomitant abnormal relaxation and   increased filling pressure (grade 2 diastolic dysfunction). There   was a large  thrombus  6/11 cMRI  1) EF 26% 2)Left anterior descending coronary arterial distribution infarct involving the entirety of the anterior cardiac wall, portions of the intraventricular septum and posterior inferior cardiac wall, with circumferential involvement and aneurysmal dilatation of the cardiac apex. This is associated with marked thinning and akinesis of the anterior wall .3)Mural thrombus identified along the anterior cardiac wall and within the apex.   Past Medical History:  Diagnosis Date  . Myocardial infarction [facility])     Current Outpatient Medications  Medication Sig Dispense Refill  . aspirin EC 81 MG tablet Take 1 tablet (81 mg total) by mouth daily. For heart health    . atorvastatin (LIPITOR) 20 MG tablet Take 1 tablet (20 mg total) by mouth daily. 90 tablet 2  . carvedilol (COREG) 6.25 MG tablet Take 6.25 mg by mouth daily.    . furosemide (LASIX) 40 MG tablet Take 40 mg by mouth daily.    . sacubitril-valsartan (ENTRESTO) 97-103 MG Take 1 tablet by mouth daily.    Marland Kitchen spironolactone (ALDACTONE) 25 MG tablet Take 0.5 tablets (12.5 mg total) by mouth daily. 45 tablet 3  . nitroGLYCERIN (NITROSTAT) 0.4 MG SL tablet Place 1 tablet (0.4 mg total) under the tongue every 5 (five) minutes as needed for chest pain. (Patient not taking: Reported on 09/14/2017) 25 tablet 12  . warfarin (COUMADIN) 5 MG tablet Take 1 tablet (5 mg total) by mouth daily. 30 tablet 11  No current facility-administered medications for this encounter.     Allergies  Allergen Reactions  . Bupropion Nausea And Vomiting      Social History   Socioeconomic History  . Marital status: Single    Spouse name: Not on file  . Number of children: Not on file  . Years of education: Not on file  . Highest education level: Not on file  Occupational History  . Not on file  Social Needs  . Financial resource strain: Not on file  . Food insecurity:    Worry: Not on file    Inability: Not on file    . Transportation needs:    Medical: Not on file    Non-medical: Not on file  Tobacco Use  . Smoking status: Current Every Day Smoker    Packs/day: 1.00    Types: Cigarettes  . Smokeless tobacco: Never Used  Substance and Sexual Activity  . Alcohol use: No  . Drug use: No  . Sexual activity: Not Currently  Lifestyle  . Physical activity:    Days per week: Not on file    Minutes per session: Not on file  . Stress: Not on file  Relationships  . Social connections:    Talks on phone: Not on file    Gets together: Not on file    Attends religious service: Not on file    Active member of club or organization: Not on file    Attends meetings of clubs or organizations: Not on file    Relationship status: Not on file  . Intimate partner violence:    Fear of current or ex partner: Not on file    Emotionally abused: Not on file    Physically abused: Not on file    Forced sexual activity: Not on file  Other Topics Concern  . Not on file  Social History Narrative  . Not on file     No family history on file.  Vitals:   10/18/17 1146  BP: 118/86  Pulse: 64  SpO2: 96%  Weight: 202 lb 9.6 oz (91.9 kg)    PHYSICAL EXAM: General:  No resp difficulty. Walked in the clinic without difficulty.  HEENT: normal Neck: supple. no JVD. Carotids 2+ bilat; no bruits. No lymphadenopathy or thryomegaly appreciated. Cor: PMI nondisplaced. Regular rate & rhythm. No rubs, gallops or murmurs. Lungs: clear Abdomen: soft, nontender, nondistended. No hepatosplenomegaly. No bruits or masses. Good bowel sounds. Extremities: no cyanosis, clubbing, rash, edema Neuro: alert & orientedx3, cranial nerves grossly intact. moves all 4 extremities w/o difficulty. Affect pleasant   ASSESSMENT & PLAN:  1. Systolic CHF, chronic due to Ischemic heart disease. S/p STJ ICD - last EF 26% by cMRI in 6/11 with no viability in LAD territory - ECHO 07/05/2017 EF 10-15% Grade II DD with LV thrombus  CPX  Test  09/18/2017  VO2 24.2, slope 27 , and RER 1.05.  -NYHA II-III. No med changes today because he has not been taking current medications.  - Volume status stable. Continue lasix 40 mg daily.  -  Continue entresto 49-51 mg twice a day. - Continue  carvedilol 3.125 bid -Continue 12.5 mg spironolactone daily.  - CPX results discussed with mild HF limitation.  2. CAD (coronary artery disease) - History of anterior STEMI with late presentation  - s/p PCI of LAD with reocclusion of stent - No s/s ischemia.  - Continue ASA 81, crestor 20 3. H/o LV thrombus - this is chronic non-compliant with  AC in past - large thrombus noted 06/2017  - On coumadin. INR now followed at the  Coumadin clinic.   4. Depression - has h/o suicide attempt after his relationship broke up.  - currently not suicidal.  - In the past he was on SSRI. Restart zoloft 100 mg daily.  -Refer to HFSW for coping strategies.    5. Tobacco abuse Discussed smoking cessation.   Today I am not making any medication changes because he has been taking meds as recommended. I  do not think he is appropriate for VAD at this time because he is not compliant for current medications. He is not he wants to pursue. Today discussed CPX results, medication compliance, and  smoking cessation.   He is not a candidate for transplant with current nicotine use.   I referred him to HF SW for coping strategies. Continue Paramedicine.   Follow up in 4 weeks.    Tonye Becket, NP  12:18 PM

## 2017-10-18 NOTE — Progress Notes (Signed)
Paramedicine CSW Initial Assessment  Bryan Gardner is enrolled in the [employer] Program through [facility].  The patient presents today in association with an Advanced Heart Failure Clinic Appointment.  Patient seen today by CSW for follow up/assistance on Health problems and/or depressive symptoms..   Social Determinants impacting successful heart failure regimen:  Housing: patient lives with girlfriend and girlfriends mother. Food: Do you have enough food? Yes  Do you know and understand healthy eating and how that affects your heart failure diagnosis? Yes  Do you follow a low salt diet?  Yes  Utilities: Do you have gas and/or electricity on in your home? Yes  Income: What is your source of income? Disability Insurance: Medicare and Medicaid Transportation: Do you have transportation to your medical appointments?Yes  If yes, how? Mother    Daily Health Needs: Do you have a scale and weigh each day?  Yes  Are you able to adhere to your medication regimen? Patient states he is challenged with medications because he doesn't like to take "water pills" at night.  Do you ever take medications differently than prescribed? Yes Patient admits to non adherence with evening pills due to side effects and also states "too much going on in the evenings". Do you know the zones of Heart Failure?  Yes  Do you know how to contact the HF Clinic appropriately with worsening symptoms or weight increases?  Yes    Do you have any identified obstacles / challenges for adherence to current treatment plan?Yes  Patient admits to past history of suicide attempt in Summer 2017 and was put on Zoloft although patient only remained on Zoloft for a short time due to lack of follow up with Behavioral Health. Patient denies any suicidal ideation but admits to depressive symptoms. Patient agrees to follow up with Paramedicine Program but states he does not want referral for  counseling at this time. Patient will be given prescription for Zoloft in the HF clinic today and provided information on PCP providers for follow up. Patient verbalizes understanding and agrees to make appointment with PCP. Patient agrees to contact CSW for support as needed and will follow up with Paramedicine program for added in home support.  Patient appears to be overwhelmed with family issues and conflicts which are impacting his health and follow up.    CSW assisted patient with Problem-solving teaching/coping strategies and referral to PCP and continued follwo up/care coordination with [employer] with the goal to Increase adequate support systems for patient/family and Improve medication compliance  [facility] will continue to coordinate and monitor patient's treatment plan. CSW continues to be available for identified needs. Lasandra Beech, LCSW, CCSW-MCS [phone]

## 2017-10-19 ENCOUNTER — Other Ambulatory Visit (HOSPITAL_COMMUNITY): Payer: Self-pay

## 2017-10-19 NOTE — Progress Notes (Signed)
Paramedicine Encounter    Patient ID: Bryan Gardner, male    DOB: [birth year], 37 y.o.   MRN: [id number]   Patient Care Team: Patient, No Pcp Per as PCP - General (General Practice)  Patient Active Problem List   Diagnosis Date Noted  . Encounter for therapeutic drug monitoring 09/11/2017  . MDD (major depressive disorder), severe (HCC) 01/06/2016  . Hyperlipidemia with target LDL less than 70 09/19/2012  . LV (left ventricular) mural thrombus 09/19/2012  . Systolic CHF, chronic (HCC) 09/18/2012  . Automatic implantable cardioverter-defibrillator in situ 09/18/2012  . Tobacco use 11/16/2010  . CAD (coronary artery disease), native coronary artery 12/01/2009  . Hypothyroidism 12/01/2009    Current Outpatient Medications:  .  atorvastatin (LIPITOR) 20 MG tablet, Take 1 tablet (20 mg total) by mouth daily., Disp: 90 tablet, Rfl: 2 .  carvedilol (COREG) 6.25 MG tablet, Take 6.25 mg by mouth daily., Disp: , Rfl:  .  furosemide (LASIX) 40 MG tablet, Take 1 tablet (40 mg total) by mouth daily., Disp: 30 tablet, Rfl: 11 .  sacubitril-valsartan (ENTRESTO) 97-103 MG, Take 1 tablet by mouth daily., Disp: , Rfl:  .  spironolactone (ALDACTONE) 25 MG tablet, Take 0.5 tablets (12.5 mg total) by mouth daily., Disp: 45 tablet, Rfl: 3 .  warfarin (COUMADIN) 5 MG tablet, Take 1 tablet (5 mg total) by mouth daily., Disp: 30 tablet, Rfl: 11 .  aspirin EC 81 MG tablet, Take 1 tablet (81 mg total) by mouth daily. For heart health (Patient not taking: Reported on 10/19/2017), Disp: , Rfl:  .  nitroGLYCERIN (NITROSTAT) 0.4 MG SL tablet, Place 1 tablet (0.4 mg total) under the tongue every 5 (five) minutes as needed for chest pain. (Patient not taking: Reported on 09/14/2017), Disp: 25 tablet, Rfl: 12 .  sertraline (ZOLOFT) 100 MG tablet, Take 1 tablet (100 mg total) by mouth daily. (Patient not taking: Reported on 10/19/2017), Disp: 30 tablet, Rfl: 0 Allergies  Allergen Reactions  . Bupropion Nausea And Vomiting       Social History   Socioeconomic History  . Marital status: Single    Spouse name: Not on file  . Number of children: Not on file  . Years of education: Not on file  . Highest education level: Not on file  Occupational History  . Not on file  Social Needs  . Financial resource strain: Not on file  . Food insecurity:    Worry: Not on file    Inability: Not on file  . Transportation needs:    Medical: Not on file    Non-medical: Not on file  Tobacco Use  . Smoking status: Current Every Day Smoker    Packs/day: 1.00    Types: Cigarettes  . Smokeless tobacco: Never Used  Substance and Sexual Activity  . Alcohol use: No  . Drug use: No  . Sexual activity: Not Currently  Lifestyle  . Physical activity:    Days per week: Not on file    Minutes per session: Not on file  . Stress: Not on file  Relationships  . Social connections:    Talks on phone: Not on file    Gets together: Not on file    Attends religious service: Not on file    Active member of club or organization: Not on file    Attends meetings of clubs or organizations: Not on file    Relationship status: Not on file  . Intimate partner violence:    Fear of current  or ex partner: Not on file    Emotionally abused: Not on file    Physically abused: Not on file    Forced sexual activity: Not on file  Other Topics Concern  . Not on file  Social History Narrative  . Not on file    Physical Exam  Constitutional: He is oriented to person, place, and time.  Neck: No JVD present.  Cardiovascular: Normal rate and regular rhythm.  Pulmonary/Chest: Effort normal and breath sounds normal.  Abdominal: Soft. He exhibits no distension.  Musculoskeletal: Normal range of motion. He exhibits no edema.  Neurological: He is alert and oriented to person, place, and time.  Skin: Skin is warm and dry.  Psychiatric: He has a normal mood and affect.        Future Appointments  Date Time Provider Department Center   10/25/2017 11:15 AM [facility]  11/13/2017  7:20 AM [facility] DEVICE REMOTES [facility]  11/16/2017 10:30 AM [facility] PA/NP [facility] None    BP 110/78 (BP Location: Left Arm, Patient Position: Sitting, Cuff Size: Large)   Pulse 97   Resp 16   Wt 203 lb (92.1 kg)   SpO2 97%   BMI 28.31 kg/m   Weight yesterday- 202 lb Last visit weight- 203 lb  Bryan Gardner was seen at home today and reported feeling well. He denied SOB, headache, dizziness or orthopnea. He had taken his medications this morning with a couple of exceptions and said his mom had refilled his pillbox. I looked at his pillbox and verified that she had filled it correctly. He does not have aspirin and has not picked up his Zoloft yet but said he would get it today. He also stated he remembered to take the medications last night which is a step in the right direction. We discussed smoking cessation  and he said that the patches did not work for him last time he tried to quit. I suggested the gum and he said he would try that. I am also reaching out to the clinic to see if there is a prescription medication that could help, though with his history of suicide attempt and current depression, I am not sure that is an option. I am also trying to encourage him to speak to a counselor about his depression but he said he has a hard time opening up to other people. I suggested that he try keeping a journal for a while so that he can get comfortable with opening up to himself and then try to seek a counselor's help when he feels more comfortable. He seemed like he was open to this idea but I will follow up next week.  Time spent with patient: 32 minutes  Jacqualine Code, EMT 10/19/17  ACTION: Home visit completed Next visit planned for 1 week

## 2017-10-25 ENCOUNTER — Ambulatory Visit (INDEPENDENT_AMBULATORY_CARE_PROVIDER_SITE_OTHER): Payer: Medicare Other | Admitting: *Deleted

## 2017-10-25 DIAGNOSIS — Z5181 Encounter for therapeutic drug level monitoring: Secondary | ICD-10-CM | POA: Diagnosis not present

## 2017-10-25 LAB — POCT INR: INR: 2.7

## 2017-10-25 NOTE — Patient Instructions (Signed)
Description   Continue taking 1 tablet everyday except 1.5 tablets on Mondays and Fridays. Recheck in 3 weeks. Coumadin Clinic [phone] Main [phone]

## 2017-10-26 ENCOUNTER — Telehealth (HOSPITAL_COMMUNITY): Payer: Self-pay

## 2017-10-26 NOTE — Telephone Encounter (Signed)
I called Bryan Gardner this morning to schedule an appointment. He did not answer the phone so I left a voicemail requesting he call me back.

## 2017-10-27 ENCOUNTER — Other Ambulatory Visit (HOSPITAL_COMMUNITY): Payer: Self-pay

## 2017-10-27 NOTE — Progress Notes (Signed)
Paramedicine Encounter    Patient ID: Bryan Gardner, male    DOB: 11/16/1980, 37 y.o.   MRN: [id number]   Patient Care Team: Patient, No Pcp Per as PCP - General (General Practice)  Patient Active Problem List   Diagnosis Date Noted  . Encounter for therapeutic drug monitoring 09/11/2017  . MDD (major depressive disorder), severe (HCC) 01/06/2016  . Hyperlipidemia with target LDL less than 70 09/19/2012  . LV (left ventricular) mural thrombus 09/19/2012  . Systolic CHF, chronic (HCC) 09/18/2012  . Automatic implantable cardioverter-defibrillator in situ 09/18/2012  . Tobacco use 11/16/2010  . CAD (coronary artery disease), native coronary artery 12/01/2009  . Hypothyroidism 12/01/2009    Current Outpatient Medications:  .  atorvastatin (LIPITOR) 20 MG tablet, Take 1 tablet (20 mg total) by mouth daily., Disp: 90 tablet, Rfl: 2 .  carvedilol (COREG) 6.25 MG tablet, Take 6.25 mg by mouth daily., Disp: , Rfl:  .  furosemide (LASIX) 40 MG tablet, Take 1 tablet (40 mg total) by mouth daily., Disp: 30 tablet, Rfl: 11 .  sacubitril-valsartan (ENTRESTO) 97-103 MG, Take 1 tablet by mouth daily., Disp: , Rfl:  .  spironolactone (ALDACTONE) 25 MG tablet, Take 0.5 tablets (12.5 mg total) by mouth daily., Disp: 45 tablet, Rfl: 3 .  warfarin (COUMADIN) 5 MG tablet, Take 1 tablet (5 mg total) by mouth daily., Disp: 30 tablet, Rfl: 11 .  aspirin EC 81 MG tablet, Take 1 tablet (81 mg total) by mouth daily. For heart health (Patient not taking: Reported on 10/19/2017), Disp: , Rfl:  .  nitroGLYCERIN (NITROSTAT) 0.4 MG SL tablet, Place 1 tablet (0.4 mg total) under the tongue every 5 (five) minutes as needed for chest pain. (Patient not taking: Reported on 09/14/2017), Disp: 25 tablet, Rfl: 12 .  sertraline (ZOLOFT) 100 MG tablet, Take 1 tablet (100 mg total) by mouth daily. (Patient not taking: Reported on 10/19/2017), Disp: 30 tablet, Rfl: 0 Allergies  Allergen Reactions  . Bupropion Nausea And Vomiting       Social History   Socioeconomic History  . Marital status: Single    Spouse name: Not on file  . Number of children: Not on file  . Years of education: Not on file  . Highest education level: Not on file  Occupational History  . Not on file  Social Needs  . Financial resource strain: Not on file  . Food insecurity:    Worry: Not on file    Inability: Not on file  . Transportation needs:    Medical: Not on file    Non-medical: Not on file  Tobacco Use  . Smoking status: Current Every Day Smoker    Packs/day: 1.00    Types: Cigarettes  . Smokeless tobacco: Never Used  Substance and Sexual Activity  . Alcohol use: No  . Drug use: No  . Sexual activity: Not Currently  Lifestyle  . Physical activity:    Days per week: Not on file    Minutes per session: Not on file  . Stress: Not on file  Relationships  . Social connections:    Talks on phone: Not on file    Gets together: Not on file    Attends religious service: Not on file    Active member of club or organization: Not on file    Attends meetings of clubs or organizations: Not on file    Relationship status: Not on file  . Intimate partner violence:    Fear of current  or ex partner: Not on file    Emotionally abused: Not on file    Physically abused: Not on file    Forced sexual activity: Not on file  Other Topics Concern  . Not on file  Social History Narrative  . Not on file    Physical Exam  Constitutional: He is oriented to person, place, and time.  Cardiovascular: Normal rate and regular rhythm.  Pulmonary/Chest: Effort normal and breath sounds normal.  Abdominal: Soft. He exhibits no distension.  Musculoskeletal: Normal range of motion. He exhibits no edema.  Neurological: He is alert and oriented to person, place, and time.  Skin: Skin is warm and dry.  Psychiatric: He has a normal mood and affect.        Future Appointments  Date Time Provider Department Center  11/13/2017  7:20 AM  [facility]  11/15/2017 10:15 AM [facility]  11/16/2017 10:30 AM [facility] PA/NP [facility] None    BP 130/70 (BP Location: Left Arm, Patient Position: Sitting, Cuff Size: Large)   Pulse (!) 56   Resp 16   Wt 203 lb (92.1 kg)   SpO2 98%   BMI 28.31 kg/m   Weight yesterday- 202 lb Last visit weight- 203 lb  [name] was seen at his mother's house today and reported feeling well. He denied SOB, headache, or orthopnea but admitted to mild, intermittent dizziness when he stands up. He said he has been doing "better" about taking his medications daily. I verified his medications and checked his pillbox to ensure it was filled properly, which it had. He had not picked up his Zoloft yet because he was not able to previously afford it but he said he has the money now and would pick it up from the pharmacy. He said he is also trying to cut back on the amount he is smoking. We spoke more about his depression and he said it only bothers him when he starts talking about his health problems.   Time spent with patient: 29 minutes  Jacqualine Code, EMT 10/27/17  ACTION: Home visit completed Next visit planned for 1 week

## 2017-11-03 ENCOUNTER — Telehealth (HOSPITAL_COMMUNITY): Payer: Self-pay | Admitting: *Deleted

## 2017-11-03 ENCOUNTER — Other Ambulatory Visit (HOSPITAL_COMMUNITY): Payer: Self-pay

## 2017-11-03 NOTE — Telephone Encounter (Signed)
Bryan Gardner called to verify Carvedilol dose. PTs med list says take Carvedilol 6.25 daily but carvedilol is a twice a day medication. Bryan Gardner has 6.25mg  tablets. Per Amys last office note on 10/18/17 she says to continue 3.125mg  twice daily but during that visit his medication list says to take 6.25mg  daily.  His previous office note on 09/06/17 Bryan Gardner says to continue 3.125mg  twice daily but his medication list says to take 6.25mg  twice daily. Bryan Gardner has been taking Carvedilol 6.25mg  twice daily. Per Bryan Gardner continue with 6.25mg  twice daily and update medication list. Bryan Gardner is aware and agreeable with plan.

## 2017-11-03 NOTE — Progress Notes (Signed)
Paramedicine Encounter    Patient ID: Bryan Gardner, male    DOB: [birth year], 37 y.o.   MRN: [id number]   Patient Care Team: Patient, No Pcp Per as PCP - General (General Practice)  Patient Active Problem List   Diagnosis Date Noted  . Encounter for therapeutic drug monitoring 09/11/2017  . MDD (major depressive disorder), severe (HCC) 01/06/2016  . Hyperlipidemia with target LDL less than 70 09/19/2012  . LV (left ventricular) mural thrombus 09/19/2012  . Systolic CHF, chronic (HCC) 09/18/2012  . Automatic implantable cardioverter-defibrillator in situ 09/18/2012  . Tobacco use 11/16/2010  . CAD (coronary artery disease), native coronary artery 12/01/2009  . Hypothyroidism 12/01/2009    Current Outpatient Medications:  .  atorvastatin (LIPITOR) 20 MG tablet, Take 1 tablet (20 mg total) by mouth daily., Disp: 90 tablet, Rfl: 2 .  carvedilol (COREG) 6.25 MG tablet, Take 6.25 mg by mouth daily., Disp: , Rfl:  .  furosemide (LASIX) 40 MG tablet, Take 1 tablet (40 mg total) by mouth daily., Disp: 30 tablet, Rfl: 11 .  sacubitril-valsartan (ENTRESTO) 97-103 MG, Take 1 tablet by mouth daily., Disp: , Rfl:  .  sertraline (ZOLOFT) 100 MG tablet, Take 1 tablet (100 mg total) by mouth daily., Disp: 30 tablet, Rfl: 0 .  spironolactone (ALDACTONE) 25 MG tablet, Take 0.5 tablets (12.5 mg total) by mouth daily., Disp: 45 tablet, Rfl: 3 .  warfarin (COUMADIN) 5 MG tablet, Take 1 tablet (5 mg total) by mouth daily., Disp: 30 tablet, Rfl: 11 .  aspirin EC 81 MG tablet, Take 1 tablet (81 mg total) by mouth daily. For heart health (Patient not taking: Reported on 10/19/2017), Disp: , Rfl:  .  nitroGLYCERIN (NITROSTAT) 0.4 MG SL tablet, Place 1 tablet (0.4 mg total) under the tongue every 5 (five) minutes as needed for chest pain. (Patient not taking: Reported on 09/14/2017), Disp: 25 tablet, Rfl: 12 Allergies  Allergen Reactions  . Bupropion Nausea And Vomiting      Social History   Socioeconomic  History  . Marital status: Single    Spouse name: Not on file  . Number of children: Not on file  . Years of education: Not on file  . Highest education level: Not on file  Occupational History  . Not on file  Social Needs  . Financial resource strain: Not on file  . Food insecurity:    Worry: Not on file    Inability: Not on file  . Transportation needs:    Medical: Not on file    Non-medical: Not on file  Tobacco Use  . Smoking status: Current Every Day Smoker    Packs/day: 1.00    Types: Cigarettes  . Smokeless tobacco: Never Used  Substance and Sexual Activity  . Alcohol use: No  . Drug use: No  . Sexual activity: Not Currently  Lifestyle  . Physical activity:    Days per week: Not on file    Minutes per session: Not on file  . Stress: Not on file  Relationships  . Social connections:    Talks on phone: Not on file    Gets together: Not on file    Attends religious service: Not on file    Active member of club or organization: Not on file    Attends meetings of clubs or organizations: Not on file    Relationship status: Not on file  . Intimate partner violence:    Fear of current or ex partner: Not on file  Emotionally abused: Not on file    Physically abused: Not on file    Forced sexual activity: Not on file  Other Topics Concern  . Not on file  Social History Narrative  . Not on file    Physical Exam  Constitutional: He is oriented to person, place, and time.  Neck: No JVD present.  Cardiovascular: Normal rate and regular rhythm.  Pulmonary/Chest: Effort normal. He has wheezes.  Abdominal: Soft.  Musculoskeletal: Normal range of motion. He exhibits no edema.  Neurological: He is alert and oriented to person, place, and time.  Skin: Skin is warm and dry.  Psychiatric: He has a normal mood and affect.        Future Appointments  Date Time Provider Department Center  11/13/2017  7:20 AM [facility]   11/15/2017 10:15 AM [facility]  11/16/2017 10:30 AM [facility] PA/NP [facility] None    BP 106/80 (BP Location: Left Arm, Patient Position: Sitting, Cuff Size: Large)   Pulse 66   Resp 16   Wt 203 lb 12.8 oz (92.4 kg)   SpO2 97%   BMI 28.42 kg/m   Weight yesterday- 203.6 lb Last visit weight- 203 lb  Bryan Gardner was seen at home today and reported feeling well. He denied SOB, dizziness, headache or orthopnea however he said he had an episode early in the week where he woke up choking and gasping for air after turning onto his back. He advised that he had been told he snores but has never had a sleep study in the past. I spoke with Tonye Becket who advised she would speak to him about a referral at his next appointment. He had bilateral wheezing but does not take any type of breathing treatments. He said he still does not have a PCP so we found one for him at [facility] and he has a new patient appointment on June 11 at 13:30. He reported missing two nights of medicine over the past week but his girlfriend "chewed him out" over it and he has now set alarms on his phone for reminders.  Time spent with patient: 39 minutes  Jacqualine Code, EMT 11/03/17  ACTION: Home visit completed Next visit planned for 2 weeks

## 2017-11-13 ENCOUNTER — Ambulatory Visit (INDEPENDENT_AMBULATORY_CARE_PROVIDER_SITE_OTHER): Payer: Medicare Other | Admitting: *Deleted

## 2017-11-13 DIAGNOSIS — I513 Intracardiac thrombosis, not elsewhere classified: Secondary | ICD-10-CM

## 2017-11-13 DIAGNOSIS — I240 Acute coronary thrombosis not resulting in myocardial infarction: Secondary | ICD-10-CM

## 2017-11-13 NOTE — Progress Notes (Signed)
Remote ICD transmission.   

## 2017-11-15 ENCOUNTER — Ambulatory Visit (INDEPENDENT_AMBULATORY_CARE_PROVIDER_SITE_OTHER): Payer: Medicare Other | Admitting: *Deleted

## 2017-11-15 DIAGNOSIS — Z5181 Encounter for therapeutic drug level monitoring: Secondary | ICD-10-CM

## 2017-11-15 LAB — POCT INR: INR: 1.6 — AB (ref 2.0–3.0)

## 2017-11-15 NOTE — Patient Instructions (Signed)
Description   Today take 1/2 tablet (already taken today's & since you missed a dose) then continue taking 1 tablet everyday except 1.5 tablets on Mondays and Fridays. Recheck in 2 weeks. Coumadin Clinic [phone] Main [phone]

## 2017-11-16 ENCOUNTER — Telehealth (HOSPITAL_COMMUNITY): Payer: Self-pay

## 2017-11-16 ENCOUNTER — Encounter (HOSPITAL_COMMUNITY): Payer: Self-pay

## 2017-11-17 ENCOUNTER — Telehealth (HOSPITAL_COMMUNITY): Payer: Self-pay

## 2017-11-17 NOTE — Telephone Encounter (Signed)
I called [name] after arriving to his clinic appointment and learning he had canceled. He did not answer so I left a voicemail requesting he call me back so we can set up a meeting.

## 2017-11-17 NOTE — Telephone Encounter (Signed)
I called [name] to schedule an appointment. He did not answer so I left a message requesting he call me back.

## 2017-11-21 ENCOUNTER — Ambulatory Visit: Payer: Self-pay | Admitting: Adult Health

## 2017-11-21 ENCOUNTER — Telehealth (HOSPITAL_COMMUNITY): Payer: Self-pay

## 2017-11-22 ENCOUNTER — Other Ambulatory Visit (HOSPITAL_COMMUNITY): Payer: Self-pay

## 2017-11-22 DIAGNOSIS — F322 Major depressive disorder, single episode, severe without psychotic features: Secondary | ICD-10-CM

## 2017-11-22 MED ORDER — SERTRALINE HCL 100 MG PO TABS: 100.0000 mg | ORAL_TABLET | Freq: Every day | ORAL | 3 refills | Status: DC

## 2017-11-22 NOTE — Telephone Encounter (Signed)
I called [name] to schedule an appointment. He did not answer so I left a voicemail requesting he call me back to schedule an appointment.

## 2017-11-23 ENCOUNTER — Telehealth (HOSPITAL_COMMUNITY): Payer: Self-pay

## 2017-11-23 NOTE — Telephone Encounter (Signed)
I called [name] to schedule an appointment. He did not answer so I left a voicemail requesting he call me back.

## 2017-11-26 IMAGING — CT CT CERVICAL SPINE W/O CM
4 of 7 series · 12 of 33 positions shown, 14 images · non-contrast
Comparison: None.

CLINICAL DATA: Suicide attempt. The patient tried to hang himself
with a television cord.

EXAM:
CT CERVICAL SPINE WITHOUT CONTRAST
TECHNIQUE: Multidetector CT imaging of the cervical spine was performed without
intravenous contrast. Multiplanar CT image reconstructions were also
generated.

[Series 8: c_spine 2.0 st · axial · 0.39mm/px · z∈[+1158,+1222]mm · 2 of 97 slices shown, 3 images]
[im 33/97  soft-tissue]
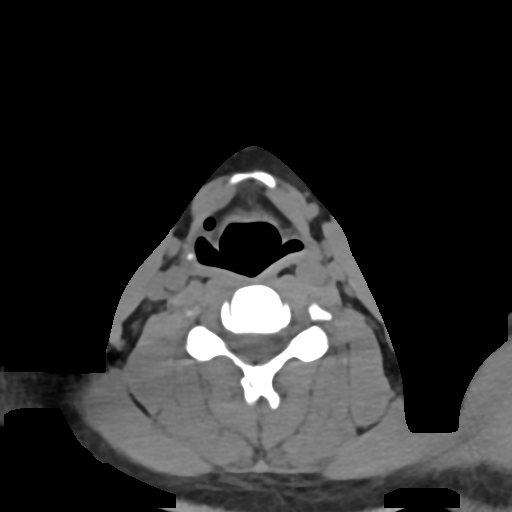
[im 33/97  bone]
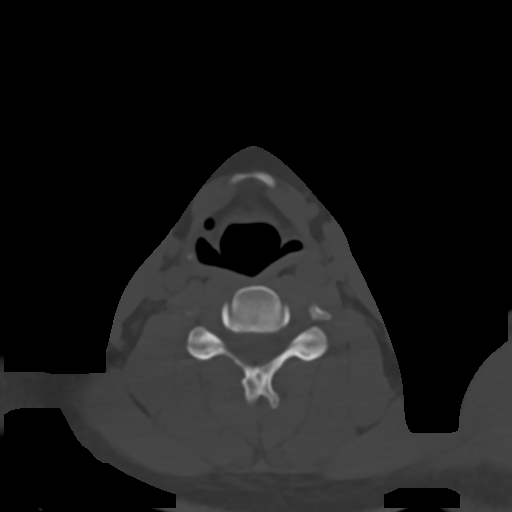
[im 65/97  bone]
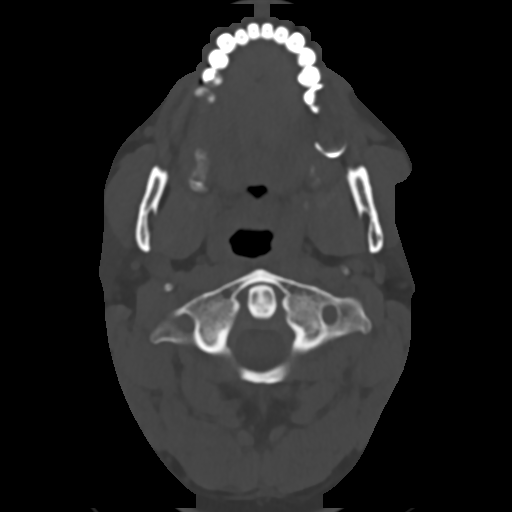

[Series 9: c_spine 2.0 st coronal · coronal · 0.34mm/px · 3 of 77 slices shown]
[im 7/77  bone]
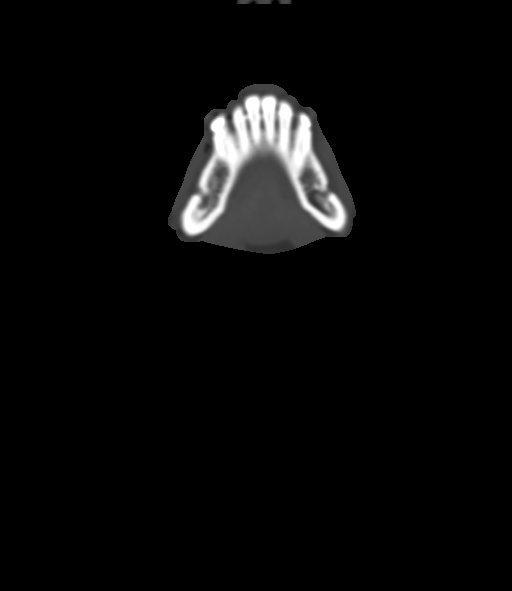
[im 39/77  bone]
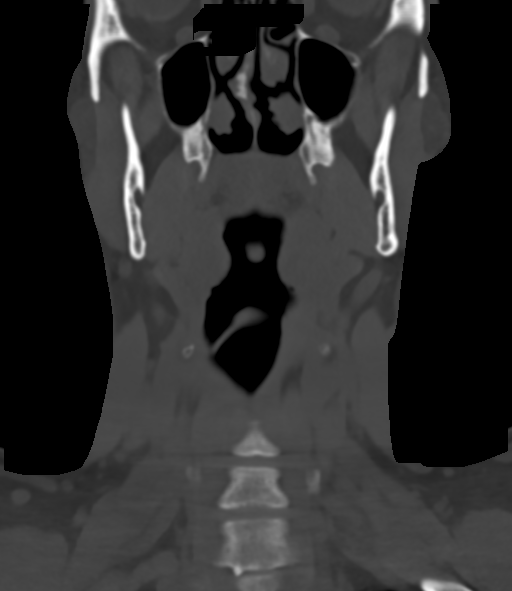
[im 71/77  bone]
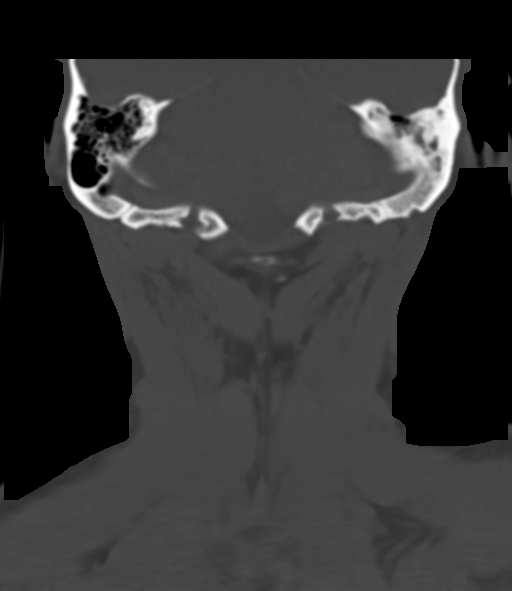

[Series 10: c_spine 2.0 st sagittal · sagittal · 0.39mm/px · 5 of 69 slices shown, 6 images]
[im 23/69  bone]
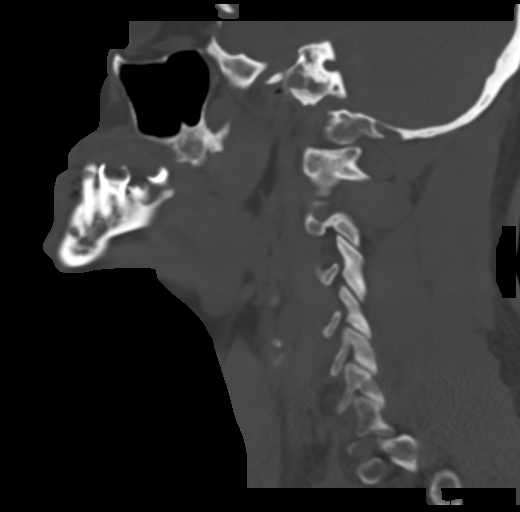
[im 29/69  bone]
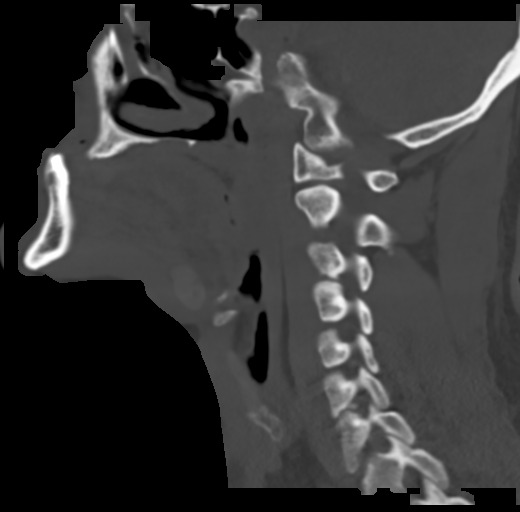
[im 35/69  soft-tissue]
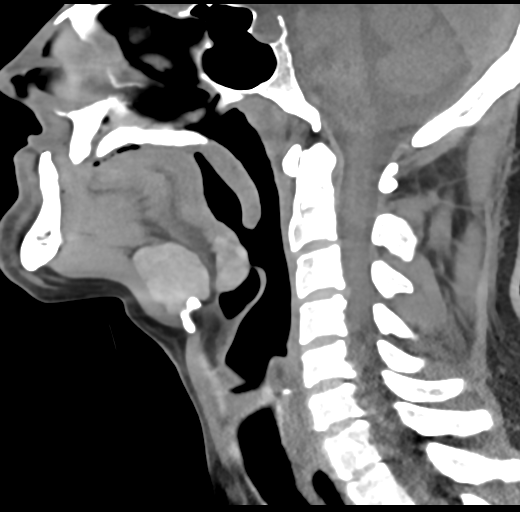
[im 35/69  bone]
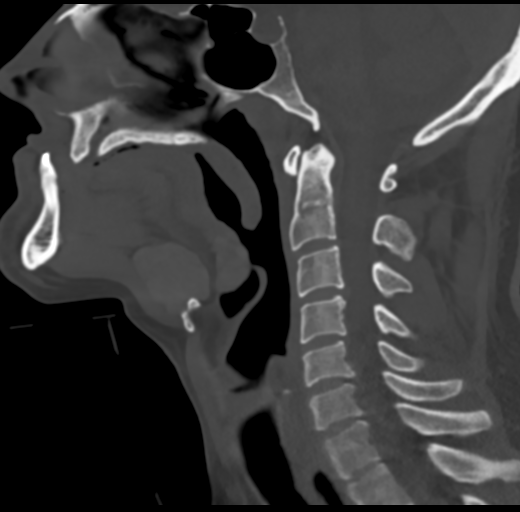
[im 40/69  bone]
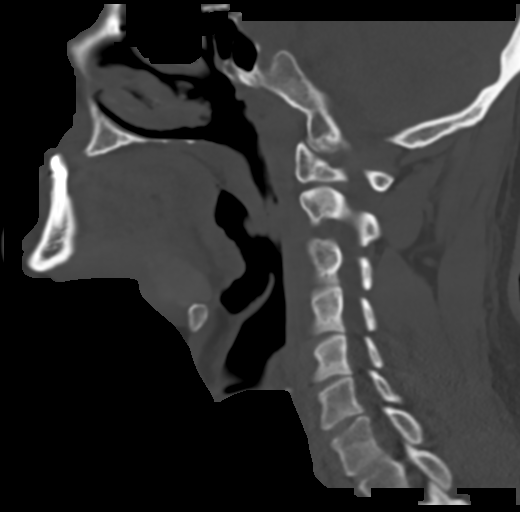
[im 46/69  bone]
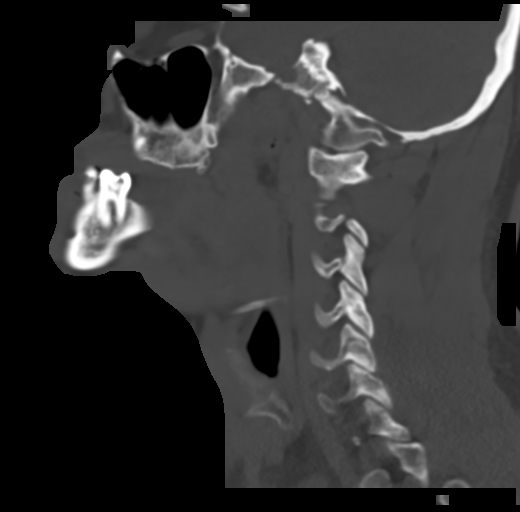

[Series 11: c_spine 2.0 st orthgonal · axial · 0.29mm/px · z∈[+1120,+1176]mm · 2 of 100 slices shown]
[im 34/100  bone]
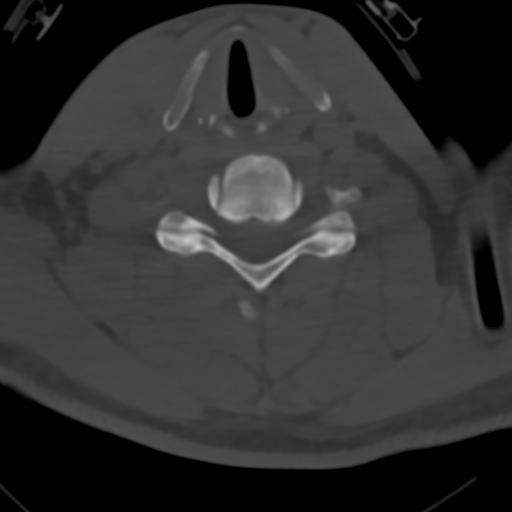
[im 67/100  bone]
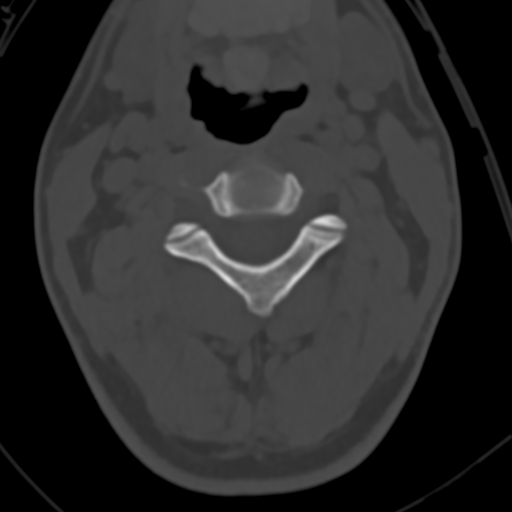

[12 of 33 positions shown; findings below may reference images not displayed]

FINDINGS: The cervical spine is imaged from skullbase through C7-T1. Vertebral
body heights alignment are maintained. Mild uncovertebral spurring
is present at C3-4, C5-6, and C6-7. Osseous foraminal narrowing is
most prominent on the left at C6-7. No acute fracture or traumatic
subluxation is present.

Despite the given history common no significant soft tissue injury
is present.

High-density tissue is present of the tongue base near the level of
the hyoid measuring 3.6 x 2.5 x 2.7 cm. There is additional high
attenuation soft tissue at the foramen cecum measuring 1.51.3 x
cm. Of note, no thyroid tissue is evident within the thyroid bed.

The lung apices demonstrate apical coli.
IMPRESSION: 1. No acute fracture or traumatic subluxation.
2. No acute soft tissue injury.
3. Ectopic thyroid tissue at tongue base without significant thyroid
at the level of thyroid cartilage. This likely represents complete
arrested migration of the thyroid.

## 2017-11-28 ENCOUNTER — Encounter (HOSPITAL_COMMUNITY): Payer: Self-pay

## 2017-11-28 ENCOUNTER — Ambulatory Visit (HOSPITAL_COMMUNITY)
Admission: RE | Admit: 2017-11-28 | Discharge: 2017-11-28 | Disposition: A | Discharge status: Home / Self Care | Payer: Medicare Other | Source: Ambulatory Visit | Attending: Cardiology | Admitting: Cardiology

## 2017-11-28 ENCOUNTER — Other Ambulatory Visit (HOSPITAL_COMMUNITY): Payer: Self-pay

## 2017-11-28 VITALS — BP 128/80 | HR 65 | Wt 202.2 lb

## 2017-11-28 DIAGNOSIS — Z7982 Long term (current) use of aspirin: Secondary | ICD-10-CM | POA: Diagnosis not present

## 2017-11-28 DIAGNOSIS — F322 Major depressive disorder, single episode, severe without psychotic features: Secondary | ICD-10-CM | POA: Diagnosis not present

## 2017-11-28 DIAGNOSIS — Z7901 Long term (current) use of anticoagulants: Secondary | ICD-10-CM | POA: Diagnosis not present

## 2017-11-28 DIAGNOSIS — I2510 Atherosclerotic heart disease of native coronary artery without angina pectoris: Secondary | ICD-10-CM | POA: Diagnosis not present

## 2017-11-28 DIAGNOSIS — F329 Major depressive disorder, single episode, unspecified: Secondary | ICD-10-CM | POA: Insufficient documentation

## 2017-11-28 DIAGNOSIS — I252 Old myocardial infarction: Secondary | ICD-10-CM | POA: Diagnosis not present

## 2017-11-28 DIAGNOSIS — Z95810 Presence of automatic (implantable) cardiac defibrillator: Secondary | ICD-10-CM | POA: Diagnosis not present

## 2017-11-28 DIAGNOSIS — Z720 Tobacco use: Secondary | ICD-10-CM

## 2017-11-28 DIAGNOSIS — I5022 Chronic systolic (congestive) heart failure: Secondary | ICD-10-CM | POA: Diagnosis not present

## 2017-11-28 DIAGNOSIS — Z79899 Other long term (current) drug therapy: Secondary | ICD-10-CM | POA: Insufficient documentation

## 2017-11-28 DIAGNOSIS — I259 Chronic ischemic heart disease, unspecified: Secondary | ICD-10-CM | POA: Diagnosis not present

## 2017-11-28 DIAGNOSIS — F17210 Nicotine dependence, cigarettes, uncomplicated: Secondary | ICD-10-CM | POA: Insufficient documentation

## 2017-11-28 LAB — BASIC METABOLIC PANEL
Anion gap: 9 (ref 5–15)
BUN: 12 mg/dL (ref 6–20)
CHLORIDE: 103 mmol/L (ref 101–111)
CO2: 25 mmol/L (ref 22–32)
Calcium: 9.5 mg/dL (ref 8.9–10.3)
Creatinine, Ser: 0.99 mg/dL (ref 0.61–1.24)
GFR calc Af Amer: >60 mL/min (ref >60)
GFR calc non Af Amer: >60 mL/min (ref >60)
GLUCOSE: 97 mg/dL (ref 65–99)
Potassium: 4.2 mmol/L (ref 3.5–5.1)
Sodium: 137 mmol/L (ref 135–145)

## 2017-11-28 MED ORDER — SPIRONOLACTONE 25 MG PO TABS: 25.0000 mg | ORAL_TABLET | Freq: Every day | ORAL | 3 refills | Status: DC

## 2017-11-28 NOTE — Progress Notes (Signed)
ADVANCED HF CLINIC   Referring Physician: S. Waters NP [facility]) Primary Care: none Primary Cardiologist: Ilda Foil MD; Caryl Ada [facility] HF at [facility]: Dr Gala Romney  HPI: Bryan Gardner is a 37 yo with a history of tobacco use, depression. CAD s/p previous anterior MI (at age 5), chronic systolic HF (EF 25%) s/p STJ ICD who is referred by Caryl Ada NP-C at [facility] to establish care in the HF Clinic in [place].   He experienced anterior MI at age 29 with apparent late presentation. Unnderwent  PCI of LAD but stent later found to be totally occluded with no viability on cMRI.  Today he returns for HF follow up. Last visit he was started on sertraline. Overall feeling better.  Denies SOB/PND/Orthopnea. No chest pain.  No BRBPR. Appetite ok. No fever or chills. Weight at home  202-203 pounds.  Taking all medications.Smoking 1 PPD. Followed by Paramedicine.    CPX 09/2017  Peak VO2: 24.2 (66% predicted peak VO2) VE/VCO2 slope: 27 OUES: 2.87 Peak RER: 1.05  Cath / PCI: --s/p PCI w/ DES to the LAD 03/2009 at [facility]  --totally occluded LAD on cath 11/2009 at [facility]    ECHO 07/05/2017 Left ventricle: Wall thickness was increased in a pattern of   severe LVH. Systolic function was severely reduced. The estimated   ejection fraction was in the range of 10% to 15%. Akinesis of the   apicalanteroseptal, lateral, inferior, and apical myocardium.   Features are consistent with a pseudonormal left ventricular   filling pattern, with concomitant abnormal relaxation and   increased filling pressure (grade 2 diastolic dysfunction). There   was a large thrombus  6/11 cMRI  1) EF 26% 2)Left anterior descending coronary arterial distribution infarct involving the entirety of the anterior cardiac wall, portions of the intraventricular septum and posterior inferior cardiac wall, with circumferential involvement and aneurysmal dilatation of the cardiac apex. This is  associated with marked thinning and akinesis of the anterior wall .3)Mural thrombus identified along the anterior cardiac wall and within the apex.   Past Medical History:  Diagnosis Date  . Myocardial infarction [facility])     Current Outpatient Medications  Medication Sig Dispense Refill  . atorvastatin (LIPITOR) 20 MG tablet Take 1 tablet (20 mg total) by mouth daily. 90 tablet 2  . carvedilol (COREG) 6.25 MG tablet Take 6.25 mg by mouth 2 (two) times daily.    . furosemide (LASIX) 40 MG tablet Take 1 tablet (40 mg total) by mouth daily. 30 tablet 11  . sacubitril-valsartan (ENTRESTO) 97-103 MG Take 1 tablet by mouth daily.    . sertraline (ZOLOFT) 100 MG tablet Take 1 tablet (100 mg total) by mouth daily. 30 tablet 3  . spironolactone (ALDACTONE) 25 MG tablet Take 0.5 tablets (12.5 mg total) by mouth daily. 45 tablet 3  . warfarin (COUMADIN) 5 MG tablet Take 1 tablet (5 mg total) by mouth daily. 30 tablet 11  . aspirin EC 81 MG tablet Take 1 tablet (81 mg total) by mouth daily. For heart health (Patient not taking: Reported on 11/28/2017)    . nitroGLYCERIN (NITROSTAT) 0.4 MG SL tablet Place 1 tablet (0.4 mg total) under the tongue every 5 (five) minutes as needed for chest pain. (Patient not taking: Reported on 11/28/2017) 25 tablet 12   No current facility-administered medications for this encounter.     Allergies  Allergen Reactions  . Bupropion Nausea And Vomiting      Social History  Socioeconomic History  . Marital status: Single    Spouse name: Not on file  . Number of children: Not on file  . Years of education: Not on file  . Highest education level: Not on file  Occupational History  . Not on file  Social Needs  . Financial resource strain: Not on file  . Food insecurity:    Worry: Not on file    Inability: Not on file  . Transportation needs:    Medical: Not on file    Non-medical: Not on file  Tobacco Use  . Smoking status: Current Every Day Smoker     Packs/day: 1.00    Types: Cigarettes  . Smokeless tobacco: Never Used  Substance and Sexual Activity  . Alcohol use: No  . Drug use: No  . Sexual activity: Not Currently  Lifestyle  . Physical activity:    Days per week: Not on file    Minutes per session: Not on file  . Stress: Not on file  Relationships  . Social connections:    Talks on phone: Not on file    Gets together: Not on file    Attends religious service: Not on file    Active member of club or organization: Not on file    Attends meetings of clubs or organizations: Not on file    Relationship status: Not on file  . Intimate partner violence:    Fear of current or ex partner: Not on file    Emotionally abused: Not on file    Physically abused: Not on file    Forced sexual activity: Not on file  Other Topics Concern  . Not on file  Social History Narrative  . Not on file     No family history on file.  Vitals:   11/28/17 1500  BP: 128/80  Pulse: 65  SpO2: 95%  Weight: 202 lb 3.2 oz (91.7 kg)     Wt Readings from Last 3 Encounters:  11/28/17 202 lb 3.2 oz (91.7 kg)  11/03/17 203 lb 12.8 oz (92.4 kg)  10/27/17 203 lb (92.1 kg)    PHYSICAL EXAM: General:  No resp difficulty. Walked in the clinic.  HEENT: normal Neck: supple. no JVD. Carotids 2+ bilat; no bruits. No lymphadenopathy or thryomegaly appreciated. Cor: PMI nondisplaced. Regular rate & rhythm. No rubs, gallops or murmurs. Lungs: clear Abdomen: soft, nontender, nondistended. No hepatosplenomegaly. No bruits or masses. Good bowel sounds. Extremities: no cyanosis, clubbing, rash, edema Neuro: alert & orientedx3, cranial nerves grossly intact. moves all 4 extremities w/o difficulty. Affect pleasant   ASSESSMENT & PLAN:  1. Systolic CHF, chronic due to Ischemic heart disease. S/p STJ ICD - last EF 26% by cMRI in 6/11 with no viability in LAD territory - ECHO 07/05/2017 EF 10-15% Grade II DD with LV thrombus  CPX  Test 09/18/2017  VO2 24.2,  slope 27 , and RER 1.05.  -NYHA II. Volume status stable. Continue lasix 40 mg daily.  -  Continue entresto 49-51 mg twice a day. - Continue  carvedilol 3.125 bid -Increase spiro to 25 mg daily.  BMET in 14 days.  2. CAD (coronary artery disease) - History of anterior STEMI with late presentation  - s/p PCI of LAD with reocclusion of stent - No s/s ischemia.  - Continue aspirin. Continue atorvastatin.  - 3. 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 He has been compliant.    4. Depression - has h/o suicide attempt  after his relationship broke up.  - currently not suicidal.  Continue  zoloft 100 mg daily. Mood improved.  5. Tobacco abuse Discussed smoking cessation.   Follow up in 6 weeks. Continue Paramedicine.   Tonye BecketAmy Braeley Buskey, NP  3:03 PM

## 2017-11-28 NOTE — Patient Instructions (Signed)
INCREASE Spironolactone to 25 mg (1 whole tablet) once daily.  Routine lab work today. Will notify you of abnormal results, otherwise no news is good news!  Follow up 2 weeks for lab work.  ______________________________________________________________ [place] Code:  Follow up 6 weeks with Amy Clegg NP-C.  _______________________________________________________________ [place] Code:  Take all medication as prescribed the day of your appointment. Bring all medications with you to your appointment.  Do the following things EVERYDAY: 1) Weigh yourself in the morning before breakfast. Write it down and keep it in a log. 2) Take your medicines as prescribed 3) Eat low salt foods-Limit salt (sodium) to 2000 mg per day.  4) Stay as active as you can everyday 5) Limit all fluids for the day to less than 2 liters

## 2017-11-29 ENCOUNTER — Ambulatory Visit (INDEPENDENT_AMBULATORY_CARE_PROVIDER_SITE_OTHER): Payer: Medicare Other | Admitting: *Deleted

## 2017-11-29 DIAGNOSIS — Z5181 Encounter for therapeutic drug level monitoring: Secondary | ICD-10-CM | POA: Diagnosis not present

## 2017-11-29 LAB — POCT INR: INR: 2 (ref 2.0–3.0)

## 2017-11-29 NOTE — Progress Notes (Signed)
Paramedicine Encounter   Patient ID: Unknown Bryan Gardner , male,   DOB: 1980-09-28,36 y.o.,  MRN: [id number]  Mr Lew DawesRitter was seen at the HF clinic today and reported feeling well. He is still smoking and does not have a plan to stop. He stated he has been taking his medications. Per Amy he is to increase Spironolactone to 25 mg daily. We spoke at length about his inconsistency in answering and returning phone calls from myself and he said he would try to be better moving forward. I will follow up next week.   Jacqualine CodeZackery R Lloyd Cullinan, EMT 11/29/2017   ACTION: Next visit planned for 1 week

## 2017-11-29 NOTE — Patient Instructions (Signed)
Description   Today take 1/2 tablet (already taken today's dose)  then continue taking 1 tablet everyday except 1.5 tablets on Mondays and Fridays. Recheck in 2-3 weeks. Coumadin Clinic [phone] Main [phone]

## 2017-11-30 LAB — CUP PACEART REMOTE DEVICE CHECK
Battery Remaining Percentage: 42 %
Battery Voltage: 2.92 V
Brady Statistic RV Percent Paced: 1 %
Date Time Interrogation Session: 20190603080018
HIGH POWER IMPEDANCE MEASURED VALUE: 56 Ohm
Implantable Lead Implant Date: 20110624
Implantable Pulse Generator Implant Date: 20110624
Lead Channel Impedance Value: 510 Ohm
Lead Channel Pacing Threshold Amplitude: 1 V
Lead Channel Sensing Intrinsic Amplitude: 11.7 mV
Lead Channel Setting Pacing Amplitude: 2.5 V
Lead Channel Setting Sensing Sensitivity: 0.5 mV
MDC IDC LEAD LOCATION: 753860
MDC IDC MSMT BATTERY REMAINING LONGEVITY: 49 mo
MDC IDC MSMT LEADCHNL RV PACING THRESHOLD PULSEWIDTH: 0.5 ms
MDC IDC SET LEADCHNL RV PACING PULSEWIDTH: 0.5 ms
Pulse Gen Serial Number: 615333

## 2017-12-01 ENCOUNTER — Other Ambulatory Visit (HOSPITAL_COMMUNITY): Payer: Self-pay | Admitting: *Deleted

## 2017-12-01 DIAGNOSIS — F322 Major depressive disorder, single episode, severe without psychotic features: Secondary | ICD-10-CM

## 2017-12-01 MED ORDER — SERTRALINE HCL 100 MG PO TABS: 100.0000 mg | ORAL_TABLET | Freq: Every day | ORAL | 3 refills | Status: DC

## 2017-12-08 ENCOUNTER — Ambulatory Visit (HOSPITAL_COMMUNITY)
Admission: RE | Admit: 2017-12-08 | Discharge: 2017-12-08 | Disposition: A | Discharge status: Home / Self Care | Payer: Medicare Other | Source: Ambulatory Visit | Attending: Internal Medicine | Admitting: Internal Medicine

## 2017-12-08 DIAGNOSIS — I5022 Chronic systolic (congestive) heart failure: Secondary | ICD-10-CM | POA: Diagnosis not present

## 2017-12-08 LAB — BASIC METABOLIC PANEL
Anion gap: 9 (ref 5–15)
BUN: 11 mg/dL (ref 6–20)
CHLORIDE: 106 mmol/L (ref 98–111)
CO2: 23 mmol/L (ref 22–32)
CREATININE: 1.07 mg/dL (ref 0.61–1.24)
Calcium: 9.4 mg/dL (ref 8.9–10.3)
GFR calc Af Amer: >60 mL/min (ref >60)
GFR calc non Af Amer: >60 mL/min (ref >60)
Glucose, Bld: 103 mg/dL — ABNORMAL HIGH (ref 70–99)
Potassium: 3.9 mmol/L (ref 3.5–5.1)
Sodium: 138 mmol/L (ref 135–145)

## 2017-12-18 ENCOUNTER — Ambulatory Visit (INDEPENDENT_AMBULATORY_CARE_PROVIDER_SITE_OTHER): Payer: Medicare Other | Admitting: Pharmacist

## 2017-12-18 DIAGNOSIS — Z5181 Encounter for therapeutic drug level monitoring: Secondary | ICD-10-CM | POA: Diagnosis not present

## 2017-12-18 LAB — POCT INR: INR: 3 (ref 2.0–3.0)

## 2017-12-18 NOTE — Patient Instructions (Signed)
Description   Continue taking 1 tablet everyday except 1.5 tablets on Mondays and Fridays. Recheck in 3 weeks. Coumadin Clinic [phone] Main [phone]

## 2017-12-19 ENCOUNTER — Telehealth (HOSPITAL_COMMUNITY): Payer: Self-pay

## 2017-12-19 NOTE — Telephone Encounter (Signed)
Attempted to call pt twice today, the 1st time it went straight to VM the next time it rang but no answer.   Bryan HoughKatie Christos Gardner, EMT-Paramedic  12/19/17

## 2017-12-21 ENCOUNTER — Other Ambulatory Visit: Payer: Self-pay | Admitting: Internal Medicine

## 2017-12-25 ENCOUNTER — Telehealth (HOSPITAL_COMMUNITY): Payer: Self-pay | Admitting: Surgery

## 2017-12-25 ENCOUNTER — Telehealth (HOSPITAL_COMMUNITY): Payer: Self-pay

## 2017-12-25 NOTE — Telephone Encounter (Signed)
Pt is refusing further paramedicine services at this time. He states he is doing well with his meds and no longer wants home visits. I advised him if he changed his mind or needed home visits again then to reach out to us or the clinic and we'll start it back up again.   Kerry HoughKatie Fritzie Prioleau, EMT-Paramedic  12/25/17

## 2017-12-25 NOTE — Telephone Encounter (Signed)
HF Community Paramedic contacted me that patient no longer wishes to receive services therefore he will be discharged from the program at this time.  [facility]

## 2018-01-08 ENCOUNTER — Ambulatory Visit (INDEPENDENT_AMBULATORY_CARE_PROVIDER_SITE_OTHER): Payer: Medicare Other | Admitting: *Deleted

## 2018-01-08 DIAGNOSIS — Z5181 Encounter for therapeutic drug level monitoring: Secondary | ICD-10-CM | POA: Diagnosis not present

## 2018-01-08 LAB — POCT INR: INR: 2 (ref 2.0–3.0)

## 2018-01-08 NOTE — Patient Instructions (Signed)
Description   Continue taking 1 tablet everyday except 1.5 tablets on Mondays and Fridays.  Recheck in 4 weeks. Coumadin Clinic [phone] Main # [phone]     

## 2018-01-11 NOTE — Progress Notes (Signed)
ADVANCED HF CLINIC   Referring Physician: S. Waters NP [facility]) Primary Care: none Primary Cardiologist: Ilda Foil MD; Caryl Ada [facility] HF at [facility]: Dr Gala Romney  HPI: Bryan Gardner is a 37 yo with a history of tobacco use, depression. CAD s/p previous anterior MI (at age 29), chronic systolic HF (EF 25%) s/p STJ ICD who is referred by Caryl Ada NP-C at [facility] to establish care in the HF Clinic in [place].   He experienced anterior MI at age 89 with apparent late presentation. Unnderwent  PCI of LAD but stent later found to be totally occluded with no viability on cMRI.  Today he returns for HF follow up. Last visit spiro was increased to 25 mg daily. Overall feeling fine.  Says he is tired after he walks 2 blocks.  Denies PND/Orthopnea. SOB with steps.  No bleeind problems. Appetite ok. No fever or chills. Weight at home 209-211  pounds. Taking all medications. Smoking 1 PPD.   CPX 09/2017  Peak VO2: 24.2 (66% predicted peak VO2) VE/VCO2 slope: 27 OUES: 2.87 Peak RER: 1.05  Cath / PCI: --s/p PCI w/ DES to the LAD 03/2009 at [facility]  --totally occluded LAD on cath 11/2009 at [facility]    ECHO 07/05/2017 Left ventricle: Wall thickness was increased in a pattern of   severe LVH. Systolic function was severely reduced. The estimated   ejection fraction was in the range of 10% to 15%. Akinesis of the   apicalanteroseptal, lateral, inferior, and apical myocardium.   Features are consistent with a pseudonormal left ventricular   filling pattern, with concomitant abnormal relaxation and   increased filling pressure (grade 2 diastolic dysfunction). There   was a large thrombus  6/11 cMRI  1) EF 26% 2)Left anterior descending coronary arterial distribution infarct involving the entirety of the anterior cardiac wall, portions of the intraventricular septum and posterior inferior cardiac wall, with circumferential involvement and aneurysmal dilatation of the  cardiac apex. This is associated with marked thinning and akinesis of the anterior wall .3)Mural thrombus identified along the anterior cardiac wall and within the apex.   Past Medical History:  Diagnosis Date  . Myocardial infarction [facility])     Current Outpatient Medications  Medication Sig Dispense Refill  . aspirin EC 81 MG tablet Take 1 tablet (81 mg total) by mouth daily. For heart health (Patient not taking: Reported on 11/28/2017)    . atorvastatin (LIPITOR) 20 MG tablet Take 1 tablet (20 mg total) by mouth daily. 90 tablet 2  . carvedilol (COREG) 6.25 MG tablet Take 6.25 mg by mouth 2 (two) times daily.    . furosemide (LASIX) 40 MG tablet Take 1 tablet (40 mg total) by mouth daily. 30 tablet 11  . nitroGLYCERIN (NITROSTAT) 0.4 MG SL tablet Place 1 tablet (0.4 mg total) under the tongue every 5 (five) minutes as needed for chest pain. (Patient not taking: Reported on 11/28/2017) 25 tablet 12  . sacubitril-valsartan (ENTRESTO) 97-103 MG Take 1 tablet by mouth daily.    . sertraline (ZOLOFT) 100 MG tablet Take 1 tablet (100 mg total) by mouth daily. 30 tablet 3  . spironolactone (ALDACTONE) 25 MG tablet Take 1 tablet (25 mg total) by mouth daily. 90 tablet 3  . warfarin (COUMADIN) 5 MG tablet Take 1 tablet (5 mg total) by mouth daily. 30 tablet 11   No current facility-administered medications for this encounter.     Allergies  Allergen Reactions  . Bupropion Nausea And Vomiting  Social History   Socioeconomic History  . Marital status: Single    Spouse name: Not on file  . Number of children: Not on file  . Years of education: Not on file  . Highest education level: Not on file  Occupational History  . Not on file  Social Needs  . Financial resource strain: Not on file  . Food insecurity:    Worry: Not on file    Inability: Not on file  . Transportation needs:    Medical: Not on file    Non-medical: Not on file  Tobacco Use  . Smoking status: Current Every  Day Smoker    Packs/day: 1.00    Types: Cigarettes  . Smokeless tobacco: Never Used  Substance and Sexual Activity  . Alcohol use: No  . Drug use: No  . Sexual activity: Not Currently  Lifestyle  . Physical activity:    Days per week: Not on file    Minutes per session: Not on file  . Stress: Not on file  Relationships  . Social connections:    Talks on phone: Not on file    Gets together: Not on file    Attends religious service: Not on file    Active member of club or organization: Not on file    Attends meetings of clubs or organizations: Not on file    Relationship status: Not on file  . Intimate partner violence:    Fear of current or ex partner: Not on file    Emotionally abused: Not on file    Physically abused: Not on file    Forced sexual activity: Not on file  Other Topics Concern  . Not on file  Social History Narrative  . Not on file     No family history on file.  Vitals:   01/12/18 1127  BP: 110/78  Pulse: 66  SpO2: 97%  Weight: 211 lb 6.4 oz (95.9 kg)     Wt Readings from Last 3 Encounters:  01/12/18 211 lb 6.4 oz (95.9 kg)  11/28/17 202 lb 3.2 oz (91.7 kg)  11/03/17 203 lb 12.8 oz (92.4 kg)    PHYSICAL EXAM: General:  Walked in the clinic without difficulty. No resp difficulty HEENT: normal Neck: supple. no JVD. Carotids 2+ bilat; no bruits. No lymphadenopathy or thryomegaly appreciated. Cor: PMI nondisplaced. Regular rate & rhythm. No rubs, gallops or murmurs. Lungs: clear Abdomen: soft, nontender, nondistended. No hepatosplenomegaly. No bruits or masses. Good bowel sounds. Extremities: no cyanosis, clubbing, rash, edema Neuro: alert & orientedx3, cranial nerves grossly intact. moves all 4 extremities w/o difficulty. Affect pleasant  ASSESSMENT & PLAN:  1. Systolic CHF, chronic due to Ischemic heart disease. S/p STJ ICD - last EF 26% by cMRI in 6/11 with no viability in LAD territory - ECHO 07/05/2017 EF 10-15% Grade II DD with LV thrombus   CPX  Test 09/18/2017  VO2 24.2, slope 27 , and RER 1.05 Reviewed Cor[employer]ue. No fluid noted. No VT.  -NYHA IIIb. Volume status stable.   -Continue lasix 40 mg daily.  -  Continue entresto 49-51 mg twice a day. - Continue carvedilol 3.125 mg twice a day. - -Continue spiro to 25 mg daily.   He has been evaluated at [facility] for transplant but not a candidate with smoking and lack of social support.  2. CAD (coronary artery disease) - History of anterior STEMI with late presentation  - s/p PCI of LAD with reocclusion of stent - No S/S ischemia. No  bleeding problems.  - Continue coumadin.  Continue atorvastatin.  - 3. 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 He has been compliant.   -INR followed at the Coumadin Clinic   4. Depression - has h/o suicide attempt after his relationship broke up.  - currently not suicidal.  Refill zoloft 100 mg daily.  [facility]  5. Tobacco abuse Discussed with smoking cessation.   Needs Paramedicine to follow again.  Follow up in 6 weeks with Dr Gala RomneyBensimhon  [facility] to [facility].  Tonye BecketAmy Kimiko Common, NP  11:27 AM

## 2018-01-12 ENCOUNTER — Encounter (HOSPITAL_COMMUNITY): Payer: Self-pay

## 2018-01-12 ENCOUNTER — Ambulatory Visit (HOSPITAL_COMMUNITY)
Admission: RE | Admit: 2018-01-12 | Discharge: 2018-01-12 | Disposition: A | Discharge status: Home / Self Care | Payer: Medicare Other | Source: Ambulatory Visit | Attending: Cardiology | Admitting: Cardiology

## 2018-01-12 VITALS — BP 110/78 | HR 66 | Wt 211.4 lb

## 2018-01-12 DIAGNOSIS — F17210 Nicotine dependence, cigarettes, uncomplicated: Secondary | ICD-10-CM | POA: Insufficient documentation

## 2018-01-12 DIAGNOSIS — Z7901 Long term (current) use of anticoagulants: Secondary | ICD-10-CM | POA: Insufficient documentation

## 2018-01-12 DIAGNOSIS — F329 Major depressive disorder, single episode, unspecified: Secondary | ICD-10-CM | POA: Diagnosis not present

## 2018-01-12 DIAGNOSIS — Z7982 Long term (current) use of aspirin: Secondary | ICD-10-CM | POA: Diagnosis not present

## 2018-01-12 DIAGNOSIS — Z7984 Long term (current) use of oral hypoglycemic drugs: Secondary | ICD-10-CM | POA: Insufficient documentation

## 2018-01-12 DIAGNOSIS — I2510 Atherosclerotic heart disease of native coronary artery without angina pectoris: Secondary | ICD-10-CM

## 2018-01-12 DIAGNOSIS — Z79899 Other long term (current) drug therapy: Secondary | ICD-10-CM | POA: Diagnosis not present

## 2018-01-12 DIAGNOSIS — F322 Major depressive disorder, single episode, severe without psychotic features: Secondary | ICD-10-CM

## 2018-01-12 DIAGNOSIS — Z955 Presence of coronary angioplasty implant and graft: Secondary | ICD-10-CM | POA: Insufficient documentation

## 2018-01-12 DIAGNOSIS — Z888 Allergy status to other drugs, medicaments and biological substances status: Secondary | ICD-10-CM | POA: Insufficient documentation

## 2018-01-12 DIAGNOSIS — Z95810 Presence of automatic (implantable) cardiac defibrillator: Secondary | ICD-10-CM

## 2018-01-12 DIAGNOSIS — I252 Old myocardial infarction: Secondary | ICD-10-CM | POA: Diagnosis not present

## 2018-01-12 DIAGNOSIS — Z09 Encounter for follow-up examination after completed treatment for conditions other than malignant neoplasm: Secondary | ICD-10-CM | POA: Diagnosis not present

## 2018-01-12 DIAGNOSIS — I5022 Chronic systolic (congestive) heart failure: Secondary | ICD-10-CM | POA: Diagnosis not present

## 2018-01-12 DIAGNOSIS — I259 Chronic ischemic heart disease, unspecified: Secondary | ICD-10-CM | POA: Insufficient documentation

## 2018-01-12 DIAGNOSIS — Z720 Tobacco use: Secondary | ICD-10-CM

## 2018-01-12 MED ORDER — SERTRALINE HCL 100 MG PO TABS: 100.0000 mg | ORAL_TABLET | Freq: Every day | ORAL | 3 refills | Status: DC

## 2018-01-12 NOTE — Patient Instructions (Signed)
[facility], they will be in contact with you to arrange orientation  Your physician recommends that you schedule a follow-up appointment in: 6-8 weeks with DR Bensimhon   Do the following things EVERYDAY: 1) Weigh yourself in the morning before breakfast. Write it down and keep it in a log. 2) Take your medicines as prescribed 3) Eat low salt foods-Limit salt (sodium) to 2000 mg per day.  4) Stay as active as you can everyday 5) Limit all fluids for the day to less than 2 liters

## 2018-01-17 DIAGNOSIS — R1032 Left lower quadrant pain: Secondary | ICD-10-CM | POA: Diagnosis not present

## 2018-01-17 DIAGNOSIS — I2510 Atherosclerotic heart disease of native coronary artery without angina pectoris: Secondary | ICD-10-CM | POA: Diagnosis not present

## 2018-01-17 DIAGNOSIS — E7800 Pure hypercholesterolemia, unspecified: Secondary | ICD-10-CM | POA: Diagnosis not present

## 2018-01-17 DIAGNOSIS — K219 Gastro-esophageal reflux disease without esophagitis: Secondary | ICD-10-CM | POA: Diagnosis not present

## 2018-01-17 DIAGNOSIS — E559 Vitamin D deficiency, unspecified: Secondary | ICD-10-CM | POA: Diagnosis not present

## 2018-01-17 DIAGNOSIS — Z716 Tobacco abuse counseling: Secondary | ICD-10-CM | POA: Diagnosis not present

## 2018-01-17 DIAGNOSIS — J449 Chronic obstructive pulmonary disease, unspecified: Secondary | ICD-10-CM | POA: Diagnosis not present

## 2018-01-17 DIAGNOSIS — F329 Major depressive disorder, single episode, unspecified: Secondary | ICD-10-CM | POA: Diagnosis not present

## 2018-01-17 DIAGNOSIS — R739 Hyperglycemia, unspecified: Secondary | ICD-10-CM | POA: Diagnosis not present

## 2018-01-17 DIAGNOSIS — Z1389 Encounter for screening for other disorder: Secondary | ICD-10-CM | POA: Diagnosis not present

## 2018-01-17 DIAGNOSIS — I509 Heart failure, unspecified: Secondary | ICD-10-CM | POA: Diagnosis not present

## 2018-01-18 ENCOUNTER — Other Ambulatory Visit (HOSPITAL_COMMUNITY): Payer: Self-pay | Admitting: Internal Medicine

## 2018-01-18 ENCOUNTER — Encounter (HOSPITAL_COMMUNITY): Payer: Self-pay

## 2018-01-18 MED ORDER — SACUBITRIL-VALSARTAN 97-103 MG PO TABS: 1.0000 | ORAL_TABLET | Freq: Every day | ORAL | 3 refills | Status: DC

## 2018-01-19 ENCOUNTER — Telehealth: Payer: Self-pay

## 2018-01-19 NOTE — Telephone Encounter (Signed)
[name] has called back and is interested in participating in the PREP.  We have discussed program details and I will be contacting him at the end of August for an intake appointment to have him ready for the next class start date to be in mid-September.

## 2018-01-19 NOTE — Telephone Encounter (Signed)
Left a VM for [name] to call back for more information regarding the [facility].

## 2018-01-31 ENCOUNTER — Other Ambulatory Visit: Payer: Self-pay | Admitting: Internal Medicine

## 2018-02-05 ENCOUNTER — Ambulatory Visit (INDEPENDENT_AMBULATORY_CARE_PROVIDER_SITE_OTHER): Payer: Medicare Other

## 2018-02-05 DIAGNOSIS — Z5181 Encounter for therapeutic drug level monitoring: Secondary | ICD-10-CM | POA: Diagnosis not present

## 2018-02-05 LAB — POCT INR: INR: 1.3 — AB (ref 2.0–3.0)

## 2018-02-05 NOTE — Patient Instructions (Signed)
Description   Take an addition 1/2 tablet today, then 2 tablets on Tuesday, and 1.5 tablets on Wednesday. Then continue taking 1 tablet every day except 1.5 tablets on Mondays and Fridays. Recheck in 1 week. Coumadin Clinic [phone] Main [phone]

## 2018-02-13 ENCOUNTER — Ambulatory Visit (INDEPENDENT_AMBULATORY_CARE_PROVIDER_SITE_OTHER): Payer: Medicare Other | Admitting: *Deleted

## 2018-02-13 DIAGNOSIS — I5022 Chronic systolic (congestive) heart failure: Secondary | ICD-10-CM | POA: Diagnosis not present

## 2018-02-13 NOTE — Progress Notes (Signed)
Remote ICD transmission.   

## 2018-02-14 ENCOUNTER — Telehealth: Payer: Self-pay

## 2018-02-14 NOTE — Telephone Encounter (Signed)
Left a VM for Bryan Gardner in regards to the PREP at [facility].

## 2018-03-12 ENCOUNTER — Telehealth (HOSPITAL_COMMUNITY): Payer: Self-pay | Admitting: Vascular Surgery

## 2018-03-12 ENCOUNTER — Encounter (HOSPITAL_COMMUNITY): Payer: Self-pay | Admitting: Internal Medicine

## 2018-03-12 LAB — CUP PACEART REMOTE DEVICE CHECK
Battery Remaining Percentage: 40 %
Brady Statistic RV Percent Paced: 1 %
Date Time Interrogation Session: 20190902085007
HighPow Impedance: 53 Ohm
Implantable Lead Location: 753860
Implantable Pulse Generator Implant Date: 20110624
Lead Channel Impedance Value: 460 Ohm
Lead Channel Pacing Threshold Pulse Width: 0.5 ms
Lead Channel Sensing Intrinsic Amplitude: 11.4 mV
Lead Channel Setting Pacing Amplitude: 2.5 V
Lead Channel Setting Pacing Pulse Width: 0.5 ms
MDC IDC LEAD IMPLANT DT: 20110624
MDC IDC MSMT BATTERY REMAINING LONGEVITY: 47 mo
MDC IDC MSMT BATTERY VOLTAGE: 2.92 V
MDC IDC MSMT LEADCHNL RV PACING THRESHOLD AMPLITUDE: 1 V
MDC IDC SET LEADCHNL RV SENSING SENSITIVITY: 0.5 mV
Pulse Gen Serial Number: 615333

## 2018-03-12 NOTE — Telephone Encounter (Signed)
Returned pt call, pt left VM stating he will not be able to make his 1:40 APPT due to transportation issues, I left VM asking pt to call back to resch appt

## 2018-04-10 ENCOUNTER — Other Ambulatory Visit: Payer: Self-pay | Admitting: Adult Health

## 2018-04-24 ENCOUNTER — Inpatient Hospital Stay (HOSPITAL_COMMUNITY): Admission: RE | Admit: 2018-04-24 | Payer: Self-pay | Source: Ambulatory Visit | Admitting: Internal Medicine

## 2018-05-15 ENCOUNTER — Ambulatory Visit (INDEPENDENT_AMBULATORY_CARE_PROVIDER_SITE_OTHER): Payer: Medicare Other

## 2018-05-15 DIAGNOSIS — I5022 Chronic systolic (congestive) heart failure: Secondary | ICD-10-CM

## 2018-05-15 NOTE — Progress Notes (Signed)
Remote ICD transmission.   

## 2018-05-16 ENCOUNTER — Telehealth: Payer: Self-pay | Admitting: *Deleted

## 2018-05-22 ENCOUNTER — Encounter: Payer: Self-pay | Admitting: Cardiology

## 2018-07-05 ENCOUNTER — Telehealth: Payer: Self-pay

## 2018-07-05 LAB — CUP PACEART REMOTE DEVICE CHECK
Battery Remaining Longevity: 44 mo
Battery Remaining Percentage: 38 %
HighPow Impedance: 55 Ohm
Implantable Lead Location: 753860
Implantable Pulse Generator Implant Date: 20110624
Lead Channel Setting Pacing Amplitude: 2.5 V
Lead Channel Setting Pacing Pulse Width: 0.5 ms
Lead Channel Setting Sensing Sensitivity: 0.5 mV
MDC IDC LEAD IMPLANT DT: 20110624
MDC IDC MSMT BATTERY VOLTAGE: 2.9 V
MDC IDC MSMT LEADCHNL RV IMPEDANCE VALUE: 460 Ohm
MDC IDC MSMT LEADCHNL RV PACING THRESHOLD AMPLITUDE: 1 V
MDC IDC MSMT LEADCHNL RV PACING THRESHOLD PULSEWIDTH: 0.5 ms
MDC IDC MSMT LEADCHNL RV SENSING INTR AMPL: 11.4 mV
MDC IDC PG SERIAL: 615333
MDC IDC SESS DTM: 20191202112240
MDC IDC STAT BRADY RV PERCENT PACED: 1 %

## 2018-07-05 NOTE — Telephone Encounter (Signed)
Called pt schedule overdue inr pt stated that they had quit taking coumadin b/c they stopped coming to the appt. Pt stated that they wanted to schedule appt and asked what to take I instructed the pt to take what the dose was of the last anticoag check and scheduled for 2/7

## 2018-07-06 ENCOUNTER — Telehealth: Payer: Self-pay

## 2018-07-06 NOTE — Telephone Encounter (Signed)
LVM for pt to return phone call to Device clinic regarding ICD Shock.

## 2018-07-09 ENCOUNTER — Encounter: Payer: Self-pay | Admitting: *Deleted

## 2018-07-11 ENCOUNTER — Other Ambulatory Visit: Payer: Self-pay | Admitting: Internal Medicine

## 2018-07-12 NOTE — Telephone Encounter (Signed)
Reviewed episode with Dr. Ladona Ridgel. Plan for f/u in February. No med changes as long as patient's BP is stable after restarting meds.  LMOVM for patient requesting call back to DC.

## 2018-07-12 NOTE — Telephone Encounter (Signed)
Able to reach patient. Patient reports he had been off all of his medications for a few weeks prior to his ICD shock on 07/05/18. He was working in the yard that day, exerting himself. He felt suddenly like "everything was about to end" prior to the shock. Denies symptoms after the shock. Patient started back on his medications that same day, reports he realized "I'm not ready to die."  Advised patient of [place] DMV driving restrictions x6 months. He verbalizes understanding. Patient plans to keep 07/23/18 CHF Clinic f/u.  Advised I will call back after discussing with Dr. Ladona Ridgel for f/u recommendations. Patient is agreeable to this plan.

## 2018-07-19 NOTE — Telephone Encounter (Signed)
LMOVM requesting call back to the DC. 

## 2018-07-20 ENCOUNTER — Ambulatory Visit (INDEPENDENT_AMBULATORY_CARE_PROVIDER_SITE_OTHER): Payer: Medicare Other | Admitting: *Deleted

## 2018-07-20 DIAGNOSIS — Z5181 Encounter for therapeutic drug level monitoring: Secondary | ICD-10-CM

## 2018-07-20 LAB — POCT INR: INR: 5 — AB (ref 2.0–3.0)

## 2018-07-20 NOTE — Patient Instructions (Signed)
Description   Do not take any Coumadin today and No Coumadin tomorrow then continue taking 1 tablet every day except 1.5 tablets on Mondays and Fridays. Recheck in 1 week. Coumadin Clinic [phone] Main [phone]

## 2018-07-23 ENCOUNTER — Encounter (HOSPITAL_COMMUNITY): Payer: Self-pay

## 2018-07-25 NOTE — Telephone Encounter (Signed)
Third attempt:  LMOVM requesting call back to the DC. Gave direct number. Advised scheduler will be reaching out to schedule f/u appointment.

## 2018-07-26 ENCOUNTER — Ambulatory Visit (INDEPENDENT_AMBULATORY_CARE_PROVIDER_SITE_OTHER): Payer: Medicare Other | Admitting: *Deleted

## 2018-07-26 DIAGNOSIS — Z5181 Encounter for therapeutic drug level monitoring: Secondary | ICD-10-CM

## 2018-07-26 LAB — POCT INR: INR: 1.7 — AB (ref 2.0–3.0)

## 2018-07-26 NOTE — Patient Instructions (Signed)
Description   Today take 1.5 tablets then continue taking 1 tablet every day except 1.5 tablets on Mondays and Fridays. Recheck in 1 week. Coumadin Clinic [phone] Main [phone]

## 2018-08-06 ENCOUNTER — Encounter (HOSPITAL_COMMUNITY): Payer: Self-pay

## 2018-08-09 ENCOUNTER — Ambulatory Visit (INDEPENDENT_AMBULATORY_CARE_PROVIDER_SITE_OTHER): Payer: Medicare Other | Admitting: Internal Medicine

## 2018-08-09 ENCOUNTER — Encounter: Payer: Self-pay | Admitting: Internal Medicine

## 2018-08-09 VITALS — BP 116/74 | HR 62 | Ht 69.0 in | Wt 209.0 lb

## 2018-08-09 DIAGNOSIS — Z95810 Presence of automatic (implantable) cardiac defibrillator: Secondary | ICD-10-CM | POA: Diagnosis not present

## 2018-08-09 DIAGNOSIS — I5022 Chronic systolic (congestive) heart failure: Secondary | ICD-10-CM | POA: Diagnosis not present

## 2018-08-09 NOTE — Patient Instructions (Addendum)
Medication Instructions:  Your physician recommends that you continue on your current medications as directed. Please refer to the Current Medication list given to you today.  Labwork: None ordered.  Testing/Procedures: None ordered.  Follow-Up: Your physician wants you to follow-up in: 6 months with Dr. Ladona Ridgel.   You will receive a reminder letter in the mail two months in advance. If you don't receive a letter, please call our office to schedule the follow-up appointment.  Remote monitoring is used to monitor your ICD from home. This monitoring reduces the number of office visits required to check your device to one time per year. It allows [place] to keep an eye on the functioning of your device to ensure it is working properly. You are scheduled for a device check from home on 08/14/2018. You may send your transmission at any time that day. If you have a wireless device, the transmission will be sent automatically. After your physician reviews your transmission, you will receive a postcard with your next transmission date.  Any Other Special Instructions Will Be Listed Below (If Applicable).  If you need a refill on your cardiac medications before your next appointment, please call your pharmacy.

## 2018-08-09 NOTE — Progress Notes (Signed)
HPI Bryan Gardner is referred today by Dr. Dorthea Cove for ongoing evaluation and management of his ICD. He is a pleasant 38 yo smoker with premature CAD, s/p MI, severe LV dysfunction, s/p prophylactic ICD insertion in 2011. He has not had syncope and denies ICD discharge. He remains active with class 2 CHF symptoms. He denies angina. He has had some problems with non-compliance and was out of his meds a few days and recently experienced an IC D shock. He admits to some non-compliance. He is still smoking.   Allergies  Allergen Reactions  . Bupropion Nausea And Vomiting     Current Outpatient Medications  Medication Sig Dispense Refill  . atorvastatin (LIPITOR) 20 MG tablet Take 1 tablet (20 mg total) by mouth daily. 90 tablet 2  . carvedilol (COREG) 6.25 MG tablet Take 6.25 mg by mouth 2 (two) times daily.    . furosemide (LASIX) 40 MG tablet Take 1 tablet (40 mg total) by mouth daily. 30 tablet 11  . furosemide (LASIX) 40 MG tablet TAKE 1 TABLET BY MOUTH ONCE DAILY. TAKE EXTRA 1 TABLET AS NEEDED FOR WEIGHT GAIN OR SHORTNESS OF BREATH. 40 tablet 5  . nitroGLYCERIN (NITROSTAT) 0.4 MG SL tablet Place 1 tablet (0.4 mg total) under the tongue every 5 (five) minutes as needed for chest pain. (Patient not taking: Reported on 11/28/2017) 25 tablet 12  . sacubitril-valsartan (ENTRESTO) 97-103 MG Take 1 tablet by mouth daily. (Patient taking differently: Take 1 tablet by mouth 2 (two) times daily. ) 60 tablet 3  . sertraline (ZOLOFT) 100 MG tablet Take 1 tablet (100 mg total) by mouth daily. 30 tablet 3  . spironolactone (ALDACTONE) 25 MG tablet Take 1 tablet (25 mg total) by mouth daily. 90 tablet 3  . warfarin (COUMADIN) 5 MG tablet Take 1 tablet (5 mg total) by mouth daily. 30 tablet 11   No current facility-administered medications for this visit.      Past Medical History:  Diagnosis Date  . Myocardial infarction (HCC)     ROS:   All systems reviewed and negative except as noted in the  HPI.   Past Surgical History:  Procedure Laterality Date  . CARDIAC SURGERY    . PACEMAKER INSERTION       History reviewed. No pertinent family history.   Social History   Socioeconomic History  . Marital status: Single    Spouse name: Not on file  . Number of children: Not on file  . Years of education: Not on file  . Highest education level: Not on file  Occupational History  . Not on file  Social Needs  . Financial resource strain: Not on file  . Food insecurity:    Worry: Not on file    Inability: Not on file  . Transportation needs:    Medical: Not on file    Non-medical: Not on file  Tobacco Use  . Smoking status: Current Every Day Smoker    Packs/day: 1.00    Types: Cigarettes  . Smokeless tobacco: Never Used  Substance and Sexual Activity  . Alcohol use: No  . Drug use: No  . Sexual activity: Not Currently  Lifestyle  . Physical activity:    Days per week: Not on file    Minutes per session: Not on file  . Stress: Not on file  Relationships  . Social connections:    Talks on phone: Not on file    Gets together: Not on file  Attends religious service: Not on file    Active member of club or organization: Not on file    Attends meetings of clubs or organizations: Not on file    Relationship status: Not on file  . Intimate partner violence:    Fear of current or ex partner: Not on file    Emotionally abused: Not on file    Physically abused: Not on file    Forced sexual activity: Not on file  Other Topics Concern  . Not on file  Social History Narrative  . Not on file     BP 116/74   Pulse 62   Ht 5\' 9"  (1.753 m)   Wt 209 lb (94.8 kg)   SpO2 95%   BMI 30.86 kg/m   Physical Exam:  Well appearing NAD HEENT: Unremarkable Neck:  No JVD, no thyromegally Lymphatics:  No adenopathy Back:  No CVA tenderness Lungs:  Clear with no wheezes HEART:  Regular rate rhythm, no murmurs, no rubs, no clicks Abd:  soft, positive bowel sounds, no  organomegally, no rebound, no guarding Ext:  2 plus pulses, no edema, no cyanosis, no clubbing Skin:  No rashes no nodules Neuro:  CN II through XII intact, motor grossly intact  EKG - nsr with IVCD  DEVICE  Normal device function.  See PaceArt for details.   Assess/Plan: 1. VT - he has had an episode of VT at 250/min for which he was defibrillation. I encouraged the patient to not miss any of his meds. It appears non-compliance is the mechanism of his VT recurrence. 2. ICD - his [employer] device is working normally. He will recheck in several months. 3. Tobacco abuse - I strongly encouraged the patient to stop smoking.  4. CAD - he is s/p MI. He will continue his current meds. He does not have angina. I have asked that he increase his activity.  Leonia Reeves.D.

## 2018-08-10 LAB — CUP PACEART INCLINIC DEVICE CHECK
Battery Remaining Longevity: 42 mo
Brady Statistic RV Percent Paced: 0.01 %
Date Time Interrogation Session: 20200227185634
HighPow Impedance: 59.059
Implantable Lead Implant Date: 20110624
Implantable Lead Location: 753860
Implantable Pulse Generator Implant Date: 20110624
Lead Channel Impedance Value: 487.5 Ohm
Lead Channel Pacing Threshold Amplitude: 1 V
Lead Channel Pacing Threshold Amplitude: 1 V
Lead Channel Pacing Threshold Pulse Width: 0.5 ms
Lead Channel Sensing Intrinsic Amplitude: 12 mV
Lead Channel Setting Pacing Amplitude: 2.5 V
Lead Channel Setting Pacing Pulse Width: 0.5 ms
MDC IDC MSMT LEADCHNL RV PACING THRESHOLD PULSEWIDTH: 0.5 ms
MDC IDC SET LEADCHNL RV SENSING SENSITIVITY: 0.5 mV
Pulse Gen Serial Number: 615333

## 2018-08-14 ENCOUNTER — Ambulatory Visit (INDEPENDENT_AMBULATORY_CARE_PROVIDER_SITE_OTHER): Payer: Medicare Other | Admitting: *Deleted

## 2018-08-14 DIAGNOSIS — Z95810 Presence of automatic (implantable) cardiac defibrillator: Secondary | ICD-10-CM

## 2018-08-14 DIAGNOSIS — I5022 Chronic systolic (congestive) heart failure: Secondary | ICD-10-CM

## 2018-08-14 LAB — CUP PACEART REMOTE DEVICE CHECK
Battery Remaining Longevity: 42 mo
Battery Remaining Percentage: 36 %
Battery Voltage: 2.89 V
Brady Statistic RV Percent Paced: 1 %
Date Time Interrogation Session: 20200302070517
HIGH POWER IMPEDANCE MEASURED VALUE: 54 Ohm
Implantable Lead Implant Date: 20110624
Implantable Lead Location: 753860
Implantable Pulse Generator Implant Date: 20110624
Lead Channel Impedance Value: 460 Ohm
Lead Channel Pacing Threshold Amplitude: 1 V
Lead Channel Pacing Threshold Pulse Width: 0.5 ms
Lead Channel Sensing Intrinsic Amplitude: 12 mV
Lead Channel Setting Pacing Amplitude: 2.5 V
Lead Channel Setting Pacing Pulse Width: 0.5 ms
Lead Channel Setting Sensing Sensitivity: 0.5 mV
Pulse Gen Serial Number: 615333

## 2018-08-22 NOTE — Progress Notes (Signed)
Remote ICD transmission.   

## 2018-08-29 ENCOUNTER — Telehealth (HOSPITAL_COMMUNITY): Payer: Self-pay

## 2018-08-29 NOTE — Telephone Encounter (Signed)
Called pt to reschedule their upcoming appointment. Left voicemail for call back.

## 2018-08-31 ENCOUNTER — Telehealth (HOSPITAL_COMMUNITY): Payer: Self-pay

## 2018-08-31 ENCOUNTER — Telehealth: Payer: Self-pay | Admitting: *Deleted

## 2018-08-31 ENCOUNTER — Telehealth: Payer: Self-pay

## 2018-08-31 NOTE — Telephone Encounter (Signed)
Called pt to reschedule his Monday appointment. He hasnt been taking his entresto and wants to restart but doesn't know what dose to restart at. The last dose he was on was 97-103 "a few weeks ago". Advice?

## 2018-08-31 NOTE — Telephone Encounter (Signed)

## 2018-08-31 NOTE — Telephone Encounter (Signed)
lmom for prescreen  

## 2018-08-31 NOTE — Telephone Encounter (Signed)
Spoke to patient about restarting entresto 97-103mg  bid. Pt agreeable. Made lab appt. Pt verbalized understanding.

## 2018-08-31 NOTE — Telephone Encounter (Signed)
He saw Dr Ladona Ridgel end of February and his BP was stable, so he can likely restart at his previous dose. He does need a BMET, as last lab work in system from 6/2019z  Thank you!   Casimiro Needle [address] [place], [place]

## 2018-09-03 ENCOUNTER — Other Ambulatory Visit: Payer: Self-pay

## 2018-09-03 ENCOUNTER — Encounter (HOSPITAL_COMMUNITY): Payer: Self-pay

## 2018-09-03 ENCOUNTER — Ambulatory Visit (INDEPENDENT_AMBULATORY_CARE_PROVIDER_SITE_OTHER): Payer: Medicare Other | Admitting: Pharmacist

## 2018-09-03 DIAGNOSIS — Z5181 Encounter for therapeutic drug level monitoring: Secondary | ICD-10-CM

## 2018-09-03 LAB — POCT INR: INR: 1.3 — AB (ref 2.0–3.0)

## 2018-09-03 NOTE — Patient Instructions (Signed)
Description   Take 2 tablets today and tomorrow, then continue taking 1 tablet every day except 1.5 tablets on Mondays and Fridays. Recheck in 1 week. [facility] [phone] Main [phone]

## 2018-09-04 NOTE — Telephone Encounter (Signed)
No call back from patient

## 2018-09-07 ENCOUNTER — Telehealth: Payer: Self-pay | Admitting: Pharmacist

## 2018-09-07 ENCOUNTER — Other Ambulatory Visit: Payer: Self-pay

## 2018-09-07 ENCOUNTER — Ambulatory Visit (HOSPITAL_COMMUNITY)
Admission: RE | Admit: 2018-09-07 | Discharge: 2018-09-07 | Disposition: A | Discharge status: Home / Self Care | Payer: Medicare Other | Source: Ambulatory Visit | Attending: Cardiology | Admitting: Cardiology

## 2018-09-07 DIAGNOSIS — I5022 Chronic systolic (congestive) heart failure: Secondary | ICD-10-CM | POA: Insufficient documentation

## 2018-09-07 LAB — BASIC METABOLIC PANEL
Anion gap: 12 (ref 5–15)
BUN: 10 mg/dL (ref 6–20)
CO2: 21 mmol/L — ABNORMAL LOW (ref 22–32)
CREATININE: 1.05 mg/dL (ref 0.61–1.24)
Calcium: 9.6 mg/dL (ref 8.9–10.3)
Chloride: 104 mmol/L (ref 98–111)
GFR calc Af Amer: >60 mL/min (ref >60)
GFR calc non Af Amer: >60 mL/min (ref >60)
Glucose, Bld: 103 mg/dL — ABNORMAL HIGH (ref 70–99)
Potassium: 4.1 mmol/L (ref 3.5–5.1)
Sodium: 137 mmol/L (ref 135–145)

## 2018-09-07 NOTE — Telephone Encounter (Signed)
This encounter was created in error - please disregard.

## 2018-09-07 NOTE — Telephone Encounter (Signed)

## 2018-09-10 ENCOUNTER — Ambulatory Visit (INDEPENDENT_AMBULATORY_CARE_PROVIDER_SITE_OTHER): Payer: Medicare Other | Admitting: Pharmacist

## 2018-09-10 DIAGNOSIS — Z5181 Encounter for therapeutic drug level monitoring: Secondary | ICD-10-CM | POA: Diagnosis not present

## 2018-09-10 LAB — POCT INR: INR: 2.2 (ref 2.0–3.0)

## 2018-09-20 ENCOUNTER — Other Ambulatory Visit (HOSPITAL_COMMUNITY): Payer: Self-pay | Admitting: Adult Health

## 2018-09-20 ENCOUNTER — Other Ambulatory Visit (HOSPITAL_COMMUNITY): Payer: Self-pay | Admitting: Internal Medicine

## 2018-09-28 ENCOUNTER — Telehealth: Payer: Self-pay

## 2018-09-28 NOTE — Telephone Encounter (Signed)
lmom for prescreen  

## 2018-10-01 ENCOUNTER — Ambulatory Visit (INDEPENDENT_AMBULATORY_CARE_PROVIDER_SITE_OTHER): Payer: Medicare Other | Admitting: *Deleted

## 2018-10-01 ENCOUNTER — Other Ambulatory Visit: Payer: Self-pay

## 2018-10-01 DIAGNOSIS — I513 Intracardiac thrombosis, not elsewhere classified: Secondary | ICD-10-CM | POA: Diagnosis not present

## 2018-10-01 DIAGNOSIS — Z5181 Encounter for therapeutic drug level monitoring: Secondary | ICD-10-CM

## 2018-10-01 DIAGNOSIS — I240 Acute coronary thrombosis not resulting in myocardial infarction: Secondary | ICD-10-CM

## 2018-10-01 LAB — POCT INR: INR: 1.3 — AB (ref 2.0–3.0)

## 2018-10-09 ENCOUNTER — Telehealth: Payer: Self-pay

## 2018-10-09 NOTE — Telephone Encounter (Signed)
lmom 

## 2018-10-09 NOTE — Telephone Encounter (Signed)
Follow Up:      Returning your call from today. 

## 2018-10-10 ENCOUNTER — Ambulatory Visit (INDEPENDENT_AMBULATORY_CARE_PROVIDER_SITE_OTHER): Payer: Medicare Other | Admitting: *Deleted

## 2018-10-10 ENCOUNTER — Other Ambulatory Visit: Payer: Self-pay

## 2018-10-10 DIAGNOSIS — Z5181 Encounter for therapeutic drug level monitoring: Secondary | ICD-10-CM | POA: Diagnosis not present

## 2018-10-10 LAB — POCT INR: INR: 2.3 (ref 2.0–3.0)

## 2018-10-15 ENCOUNTER — Other Ambulatory Visit (HOSPITAL_COMMUNITY): Payer: Self-pay | Admitting: Adult Health

## 2018-10-15 ENCOUNTER — Other Ambulatory Visit (HOSPITAL_COMMUNITY): Payer: Self-pay | Admitting: Internal Medicine

## 2018-11-13 ENCOUNTER — Telehealth: Payer: Self-pay

## 2018-11-13 ENCOUNTER — Encounter: Payer: Medicare Other | Admitting: *Deleted

## 2018-11-13 NOTE — Telephone Encounter (Signed)

## 2018-11-14 ENCOUNTER — Telehealth: Payer: Self-pay

## 2018-11-14 NOTE — Telephone Encounter (Signed)
Left message for patient to remind of missed remote transmission.  

## 2018-11-21 ENCOUNTER — Encounter: Payer: Self-pay | Admitting: Cardiology

## 2018-11-21 NOTE — Progress Notes (Signed)
Letter  

## 2018-11-26 ENCOUNTER — Other Ambulatory Visit: Payer: Self-pay

## 2018-11-26 ENCOUNTER — Ambulatory Visit (INDEPENDENT_AMBULATORY_CARE_PROVIDER_SITE_OTHER): Payer: Medicare Other | Admitting: Pharmacist

## 2018-11-26 DIAGNOSIS — Z5181 Encounter for therapeutic drug level monitoring: Secondary | ICD-10-CM

## 2018-11-26 LAB — POCT INR: INR: 1.6 — AB (ref 2.0–3.0)

## 2018-11-26 NOTE — Patient Instructions (Signed)
Description   Take an extra 1/2 tablet today and take 1.5 tablets tomorrow, then continue taking 1 tablet every day except 1.5 tablets on Mondays and Fridays. Recheck in 2 weeks. [facility] [phone] Main [phone]

## 2018-12-03 ENCOUNTER — Telehealth: Payer: Self-pay

## 2018-12-03 NOTE — Telephone Encounter (Signed)

## 2018-12-07 ENCOUNTER — Ambulatory Visit (INDEPENDENT_AMBULATORY_CARE_PROVIDER_SITE_OTHER): Payer: Medicare Other | Admitting: Pharmacist

## 2018-12-07 ENCOUNTER — Telehealth (HOSPITAL_COMMUNITY): Payer: Self-pay

## 2018-12-07 ENCOUNTER — Other Ambulatory Visit: Payer: Self-pay

## 2018-12-07 DIAGNOSIS — Z5181 Encounter for therapeutic drug level monitoring: Secondary | ICD-10-CM

## 2018-12-07 LAB — POCT INR: INR: 1.8 — AB (ref 2.0–3.0)

## 2018-12-07 MED ORDER — WARFARIN SODIUM 5 MG PO TABS: 5.0000 mg | ORAL_TABLET | Freq: Every day | ORAL | 1 refills | Status: AC

## 2018-12-07 NOTE — Patient Instructions (Signed)
Description   Take an extra 1/2 tablet today, then start taking 1 tablet every day except 1.5 tablets on Mondays, Wednesdays, and Fridays. Recheck in 2 weeks. [facility] [phone] Main [phone]

## 2018-12-07 NOTE — Telephone Encounter (Signed)
Received call from pt requesting refills. Pt didn't state which he needed. Called pt back. Left voicemail for call back. Other line out of service.

## 2018-12-09 ENCOUNTER — Other Ambulatory Visit (HOSPITAL_COMMUNITY): Payer: Self-pay | Admitting: Internal Medicine

## 2018-12-17 ENCOUNTER — Telehealth: Payer: Self-pay

## 2018-12-17 NOTE — Telephone Encounter (Signed)

## 2018-12-19 ENCOUNTER — Other Ambulatory Visit: Payer: Self-pay

## 2018-12-19 ENCOUNTER — Ambulatory Visit (INDEPENDENT_AMBULATORY_CARE_PROVIDER_SITE_OTHER): Payer: Medicare Other | Admitting: Family Medicine

## 2018-12-19 ENCOUNTER — Encounter: Payer: Self-pay | Admitting: Family Medicine

## 2018-12-19 VITALS — BP 112/80 | HR 70 | Ht 69.0 in | Wt 214.4 lb

## 2018-12-19 DIAGNOSIS — Z7689 Persons encountering health services in other specified circumstances: Secondary | ICD-10-CM

## 2018-12-19 DIAGNOSIS — I5022 Chronic systolic (congestive) heart failure: Secondary | ICD-10-CM

## 2018-12-19 DIAGNOSIS — E785 Hyperlipidemia, unspecified: Secondary | ICD-10-CM | POA: Diagnosis not present

## 2018-12-19 DIAGNOSIS — Z720 Tobacco use: Secondary | ICD-10-CM

## 2018-12-19 MED ORDER — CHANTIX STARTING MONTH PAK 0.5 MG X 11 & 1 MG X 42 PO TABS: ORAL_TABLET | ORAL | 0 refills | Status: DC

## 2018-12-19 NOTE — Progress Notes (Signed)
    Subjective:  NELSON NOONE is a 38 y.o. male who presents to the [facility] today to establish care with new PCP HPI:  Past medical history significant for large MI that occurred in mid 2000's.  Patient HFrEF with EF of 10 to 15% with G2DD.  Patient has history of tobacco abuse and smokes 1/2 pack a day.  Patient has biventricular pacing/ICD in place.  Follows with Dr. Haroldine Laws for heart failure clinic.  Medications include daily Entresto, carvedilol, furosemide, nitroglycerin as needed, spironolactone.  Patient has COPD and takes daily Symbicort, albuterol as needed.  Patient follows with cardiology with Dr. Osie Cheeks.  Patient is ON ANTIcogaulation due to "blood clot in heart" with warfarin.  Patient has a history of MVP and has been on Zoloft in the past, however is not on any therapy at the time.  Patient is interested in quitting smoking and is open to trying the quit line and starting Chantix today.  He says that patches do not help him.  Says that he does not qualify for transplant last month she can stop smoking.   ROS: Per HPI  PMH: Smoking history reviewed.    Objective:  Physical Exam: BP 112/80   Pulse 70   Ht 5\' 9"  (1.753 m)   Wt 214 lb 6 oz (97.2 kg)   SpO2 96%   BMI 31.66 kg/m   Gen: NAD, resting comfortably CV: RRR with no murmurs appreciated Pulm: NWOB, CTAB with no crackles, wheezes, or rhonchi GI: Normal bowel sounds present. Soft, Nontender, Nondistended. MSK: no edema, cyanosis, or clubbing noted Skin: warm, dry Neuro: grossly normal, moves all extremities Psych: Normal affect and thought content  No results found for this or any previous visit (from the past 72 hour(s)).   Assessment/Plan:  Establishing care.  Recently had BMP which was in normal limits.  Will get routine screening labs including A1c, lipid panel, CBC given heart failure and family history of diabetes.  For smoking cessation will start Chantix.  Will have patient follow-up with Dr.  Valentina Lucks for smoking cessation and PFTs baseline.   Lab Orders     Lipid panel     TSH     CBC with Differential  Meds ordered this encounter  Medications  . varenicline (CHANTIX STARTING MONTH PAK) 0.5 MG X 11 & 1 MG X 42 tablet    Sig: Take one 0.5 mg tablet by mouth once daily for 3 days, then increase to one 0.5 mg tablet twice daily for 4 days, then increase to one 1 mg tablet twice daily.    Dispense:  53 tablet    Refill:  0      Marny Lowenstein, MD, MS FAMILY MEDICINE RESIDENT - PGY2 12/19/2018 4:13 PM

## 2018-12-19 NOTE — Patient Instructions (Addendum)
It was a pleasure to see you today! Thank you for choosing [facility] for your primary care. Bryan Gardner was seen for to establish care with new PCP.   1. For smoking cessation, please [id number], start chantix, and see Dr. Valentina Lucks for PFTs and smoking cessation.    2. For heart failure, we are getting labs: lipid panel, CBC, TSH.   Come back to the clinic if you need medicine refills or have any other issues.   Best,  Marny Lowenstein, MD, MS FAMILY MEDICINE RESIDENT - PGY2 12/19/2018 3:47 PM

## 2018-12-20 ENCOUNTER — Telehealth: Payer: Self-pay | Admitting: Family Medicine

## 2018-12-20 LAB — CBC WITH DIFFERENTIAL/PLATELET
Basophils Absolute: 0.1 10*3/uL (ref 0.0–0.2)
Basos: 1 %
EOS (ABSOLUTE): 0.2 10*3/uL (ref 0.0–0.4)
Eos: 2 %
Hematocrit: 42.3 % (ref 37.5–51.0)
Hemoglobin: 14.8 g/dL (ref 13.0–17.7)
Immature Grans (Abs): 0 10*3/uL (ref 0.0–0.1)
Immature Granulocytes: 0 %
Lymphocytes Absolute: 3.4 10*3/uL — ABNORMAL HIGH (ref 0.7–3.1)
Lymphs: 36 %
MCH: 29.5 pg (ref 26.6–33.0)
MCHC: 35 g/dL (ref 31.5–35.7)
MCV: 84 fL (ref 79–97)
Monocytes Absolute: 0.8 10*3/uL (ref 0.1–0.9)
Monocytes: 8 %
Neutrophils Absolute: 4.9 10*3/uL (ref 1.4–7.0)
Neutrophils: 53 %
Platelets: 272 10*3/uL (ref 150–450)
RBC: 5.01 x10E6/uL (ref 4.14–5.80)
RDW: 13.5 % (ref 11.6–15.4)
WBC: 9.4 10*3/uL (ref 3.4–10.8)

## 2018-12-20 LAB — LIPID PANEL
Chol/HDL Ratio: 6.5 ratio — ABNORMAL HIGH (ref 0.0–5.0)
Cholesterol, Total: 189 mg/dL (ref 100–199)
HDL: 29 mg/dL — ABNORMAL LOW (ref >39)
LDL Calculated: 100 mg/dL — ABNORMAL HIGH (ref 0–99)
Triglycerides: 302 mg/dL — ABNORMAL HIGH (ref 0–149)
VLDL Cholesterol Cal: 60 mg/dL — ABNORMAL HIGH (ref 5–40)

## 2018-12-20 LAB — TSH: TSH: 7.05 u[IU]/mL — ABNORMAL HIGH (ref 0.450–4.500)

## 2018-12-20 NOTE — Telephone Encounter (Signed)
Patient with history of hyperlipidemia only on atorvastatin 20.  Got repeat lipid panel showed that he had LDL of 100.  Patient likely needs moderate high intensity statin given his history of prior MI.  Do not know if patient is been intolerant of statins.  Call patient to inquire about this.  Patient also tested mildly hypothyroid with TSH of 7.0.  Will likely need to get T4 to verify and inquire patient is experiencing any hypothyroid symptoms.  Left voicemail because patient did not answer.  Patient to call clinic back when he gets worse to discuss further.  Case discussed with Dr. Erin Hearing.

## 2018-12-21 ENCOUNTER — Other Ambulatory Visit: Payer: Self-pay

## 2018-12-21 DIAGNOSIS — Z720 Tobacco use: Secondary | ICD-10-CM

## 2018-12-24 MED ORDER — CHANTIX STARTING MONTH PAK 0.5 MG X 11 & 1 MG X 42 PO TABS: ORAL_TABLET | ORAL | 0 refills | Status: AC

## 2018-12-26 ENCOUNTER — Other Ambulatory Visit: Payer: Self-pay

## 2018-12-26 ENCOUNTER — Ambulatory Visit (INDEPENDENT_AMBULATORY_CARE_PROVIDER_SITE_OTHER): Payer: Medicare Other | Admitting: Pharmacist

## 2018-12-26 DIAGNOSIS — Z5181 Encounter for therapeutic drug level monitoring: Secondary | ICD-10-CM | POA: Diagnosis not present

## 2018-12-26 LAB — POCT INR: INR: 1.1 — AB (ref 2.0–3.0)

## 2018-12-26 NOTE — Patient Instructions (Signed)
Take an extra half a tablet today, 1.5 tablets tomorrow then continue taking 1 tablet every day except 1.5 tablets on Mondays, Wednesdays, and Fridays. Recheck in 2 weeks. [facility] [phone] Main [phone]

## 2018-12-27 ENCOUNTER — Ambulatory Visit: Payer: Medicare Other | Admitting: Pharmacist

## 2019-01-03 ENCOUNTER — Telehealth: Payer: Self-pay

## 2019-01-03 NOTE — Telephone Encounter (Signed)
Received fax from [employer] requesting a PA for Chantix. PA completed through covermymeds. Will check back for status, could take 1-3 days.   DVV:[id number]

## 2019-01-03 NOTE — Telephone Encounter (Signed)
Medication approved. Pharmacy has been notified.

## 2019-01-05 ENCOUNTER — Other Ambulatory Visit: Payer: Self-pay

## 2019-01-05 ENCOUNTER — Emergency Department (HOSPITAL_COMMUNITY)
Admission: EM | Admit: 2019-01-05 | Discharge: 2019-01-06 | Disposition: A | Discharge status: Home / Self Care | Payer: Medicare Other | Attending: Emergency Medicine | Admitting: Emergency Medicine

## 2019-01-05 ENCOUNTER — Encounter (HOSPITAL_COMMUNITY): Payer: Self-pay | Admitting: Emergency Medicine

## 2019-01-05 DIAGNOSIS — R202 Paresthesia of skin: Secondary | ICD-10-CM | POA: Diagnosis not present

## 2019-01-05 DIAGNOSIS — Z5321 Procedure and treatment not carried out due to patient leaving prior to being seen by health care provider: Secondary | ICD-10-CM | POA: Diagnosis not present

## 2019-01-05 LAB — DIFFERENTIAL
Abs Immature Granulocytes: 0.05 10*3/uL (ref 0.00–0.07)
Basophils Absolute: 0.1 10*3/uL (ref 0.0–0.1)
Basophils Relative: 1 %
Eosinophils Absolute: 0.3 10*3/uL (ref 0.0–0.5)
Eosinophils Relative: 2 %
Immature Granulocytes: 1 %
Lymphocytes Relative: 34 %
Lymphs Abs: 3.7 10*3/uL (ref 0.7–4.0)
Monocytes Absolute: 0.9 10*3/uL (ref 0.1–1.0)
Monocytes Relative: 8 %
Neutro Abs: 6.1 10*3/uL (ref 1.7–7.7)
Neutrophils Relative %: 54 %

## 2019-01-05 LAB — COMPREHENSIVE METABOLIC PANEL
ALT: 25 U/L (ref 0–44)
AST: 21 U/L (ref 15–41)
Albumin: 4.3 g/dL (ref 3.5–5.0)
Alkaline Phosphatase: 71 U/L (ref 38–126)
Anion gap: 12 (ref 5–15)
BUN: 11 mg/dL (ref 6–20)
CO2: 22 mmol/L (ref 22–32)
Calcium: 9.6 mg/dL (ref 8.9–10.3)
Chloride: 101 mmol/L (ref 98–111)
Creatinine, Ser: 1.1 mg/dL (ref 0.61–1.24)
GFR calc Af Amer: >60 mL/min (ref >60)
GFR calc non Af Amer: >60 mL/min (ref >60)
Glucose, Bld: 107 mg/dL — ABNORMAL HIGH (ref 70–99)
Potassium: 4.1 mmol/L (ref 3.5–5.1)
Sodium: 135 mmol/L (ref 135–145)
Total Bilirubin: 0.4 mg/dL (ref 0.3–1.2)
Total Protein: 7.2 g/dL (ref 6.5–8.1)

## 2019-01-05 LAB — CBC
HCT: 44.6 % (ref 39.0–52.0)
Hemoglobin: 15.3 g/dL (ref 13.0–17.0)
MCH: 29.3 pg (ref 26.0–34.0)
MCHC: 34.3 g/dL (ref 30.0–36.0)
MCV: 85.3 fL (ref 80.0–100.0)
Platelets: 304 10*3/uL (ref 150–400)
RBC: 5.23 MIL/uL (ref 4.22–5.81)
RDW: 12.8 % (ref 11.5–15.5)
WBC: 11.1 10*3/uL — ABNORMAL HIGH (ref 4.0–10.5)
nRBC: 0 % (ref 0.0–0.2)

## 2019-01-05 NOTE — ED Triage Notes (Addendum)
Pt here for eveal of 4 days of left ring and pinky finger numbness that started when he was watching tv. Denies injury. No neurological deficits. Pt states about 3-4 weeks ago he had intermittent numbness and tingling to there right groin that went away.  Denies any pain anywhere. Pt states " I think it is related to my heart failure." denies chest pain, sob, swelling or weight gain. States he goes to store every day and noticed a sore throat started today.

## 2019-01-06 NOTE — ED Notes (Signed)
Patient not in waiting room  

## 2019-01-06 NOTE — ED Notes (Signed)
Attempt made to update pt's VS no respond gotten.

## 2019-01-07 ENCOUNTER — Other Ambulatory Visit: Payer: Self-pay | Admitting: *Deleted

## 2019-01-07 MED ORDER — CARVEDILOL 6.25 MG PO TABS: 6.2500 mg | ORAL_TABLET | Freq: Two times a day (BID) | ORAL | 0 refills | Status: AC

## 2019-01-07 MED ORDER — SPIRONOLACTONE 25 MG PO TABS: 25.0000 mg | ORAL_TABLET | Freq: Every day | ORAL | 2 refills | Status: AC

## 2019-01-08 ENCOUNTER — Telehealth: Payer: Self-pay | Admitting: Pharmacist

## 2019-01-08 NOTE — Telephone Encounter (Addendum)
1. COVID-19 Pre-Screening Questions:  . In the past 7 to 10 days have you had a cough,  shortness of breath, headache, congestion, fever (100 or greater) body aches, chills, sore throat, or sudden loss of taste or sense of smell?  Had a sore throat from drainage. No longer has any symptoms . Have you been around anyone with known Covid 19.  no . Have you been around anyone who is awaiting Covid 19 test results in the past 7 to 10 days?  no . Have you been around anyone who has been exposed to Covid 19, or has mentioned symptoms of Covid 19 within the past 7 to 10 days?  no    2. Pt advised of visitor restrictions (no visitors allowed except if needed to conduct the visit). Also advised to arrive at appointment time and wear a mask.

## 2019-01-09 ENCOUNTER — Other Ambulatory Visit: Payer: Self-pay

## 2019-01-09 ENCOUNTER — Ambulatory Visit (INDEPENDENT_AMBULATORY_CARE_PROVIDER_SITE_OTHER): Payer: Medicare Other

## 2019-01-09 DIAGNOSIS — Z5181 Encounter for therapeutic drug level monitoring: Secondary | ICD-10-CM

## 2019-01-09 LAB — POCT INR: INR: 1.8 — AB (ref 2.0–3.0)

## 2019-01-09 NOTE — Patient Instructions (Addendum)
Description   Take an extra half a tablet today, then start taking 1 tablet every day except 1.5 tablets on Mondays, Wednesdays, Fridays, and Saturdays. Recheck in 3 weeks. Pt moving to [facility], aware needs to est with cardiologist or PCP there to manage and monitor Warfarin.  Gave rx for one time draw in 3 weeks if unable to get in with MD soon enough for next INR.  [facility] [phone] Main [phone]

## 2019-02-01 ENCOUNTER — Telehealth: Payer: Self-pay

## 2019-02-01 NOTE — Telephone Encounter (Signed)
Left a message regarding appt on 02/04/19. 

## 2019-02-04 ENCOUNTER — Encounter: Payer: Medicare Other | Admitting: Internal Medicine

## 2019-02-08 ENCOUNTER — Telehealth: Payer: Self-pay | Admitting: *Deleted

## 2019-02-08 NOTE — Telephone Encounter (Addendum)
Pt is overdue for INR and was supposed to go on 01/30/2019 to have INR in [place] as he has moved since last visit. Pt was given an order to do so and per Home Health book it is documented we tried to reach the pt on 02/04/2019 and we had to leave a message. Today called both numbers listed and unable to reach him so left a message on the alternate one with our number. Will await for pt to call back to update.

## 2019-02-11 NOTE — Telephone Encounter (Signed)
Left message on voicemail for patient to return call. 

## 2019-02-12 NOTE — Telephone Encounter (Signed)
Called pt and had to leave a message

## 2019-02-14 NOTE — Telephone Encounter (Signed)
Called pt and had to leave a message regarding him being overdue to have INR checked. We have been trying to reach the pt since 02/04/2019-first documented in our Home Health/Remote INR Book.  Pt has not returned any phone calls to [place] regarding this matter to date. On the last visit in our office on 01/09/2019, the documentation states that the pt was given a one time order to have INR checked and instructed to get in with a Physician office in his new location in [place] to have them manage.

## 2019-02-15 NOTE — Telephone Encounter (Signed)
Left a message for pt to call back

## 2019-02-20 NOTE — Telephone Encounter (Signed)
Pt has not called back to date after leaving multiple messages. We are unable to manage or assist the pt if he does not communicate with [place]. At this time, will discontinue the Anticoagulation Track since it's been over a week with trying to reach the patient.

## 2019-08-20 DIAGNOSIS — Z23 Encounter for immunization: Secondary | ICD-10-CM | POA: Diagnosis not present

## 2019-08-20 DIAGNOSIS — Z1159 Encounter for screening for other viral diseases: Secondary | ICD-10-CM | POA: Diagnosis not present

## 2019-08-20 DIAGNOSIS — E785 Hyperlipidemia, unspecified: Secondary | ICD-10-CM | POA: Diagnosis not present

## 2019-08-20 DIAGNOSIS — Z114 Encounter for screening for human immunodeficiency virus [HIV]: Secondary | ICD-10-CM | POA: Diagnosis not present

## 2019-08-20 DIAGNOSIS — F17210 Nicotine dependence, cigarettes, uncomplicated: Secondary | ICD-10-CM | POA: Diagnosis not present

## 2019-08-20 DIAGNOSIS — I2510 Atherosclerotic heart disease of native coronary artery without angina pectoris: Secondary | ICD-10-CM | POA: Diagnosis not present

## 2019-08-20 DIAGNOSIS — Z20822 Contact with and (suspected) exposure to covid-19: Secondary | ICD-10-CM | POA: Diagnosis not present

## 2019-08-20 DIAGNOSIS — Z7901 Long term (current) use of anticoagulants: Secondary | ICD-10-CM | POA: Diagnosis not present

## 2019-08-20 DIAGNOSIS — I509 Heart failure, unspecified: Secondary | ICD-10-CM | POA: Diagnosis not present

## 2019-08-20 DIAGNOSIS — Z131 Encounter for screening for diabetes mellitus: Secondary | ICD-10-CM | POA: Diagnosis not present

## 2019-08-20 DIAGNOSIS — J449 Chronic obstructive pulmonary disease, unspecified: Secondary | ICD-10-CM | POA: Diagnosis not present

## 2021-05-27 ENCOUNTER — Ambulatory Visit (INDEPENDENT_AMBULATORY_CARE_PROVIDER_SITE_OTHER): Payer: Medicare Other

## 2021-05-27 DIAGNOSIS — I513 Intracardiac thrombosis, not elsewhere classified: Secondary | ICD-10-CM | POA: Diagnosis not present

## 2021-05-27 LAB — CUP PACEART REMOTE DEVICE CHECK
Battery Remaining Longevity: 17 mo
Battery Remaining Percentage: 14 %
Battery Voltage: 2.72 V
Brady Statistic RV Percent Paced: 1 %
Date Time Interrogation Session: 20221215100421
HighPow Impedance: 63 Ohm
Implantable Lead Implant Date: 20110624
Implantable Lead Location: 753860
Implantable Pulse Generator Implant Date: 20110624
Lead Channel Impedance Value: 510 Ohm
Lead Channel Pacing Threshold Amplitude: 1 V
Lead Channel Pacing Threshold Pulse Width: 0.5 ms
Lead Channel Sensing Intrinsic Amplitude: 12 mV
Lead Channel Setting Pacing Amplitude: 2.5 V
Lead Channel Setting Pacing Pulse Width: 0.5 ms
Lead Channel Setting Sensing Sensitivity: 0.5 mV
Pulse Gen Serial Number: 615333

## 2021-06-08 NOTE — Progress Notes (Signed)
Remote ICD transmission.   

## 2021-11-16 ENCOUNTER — Encounter: Payer: Self-pay | Admitting: *Deleted
